# Patient Record
Sex: Female | Born: 1945 | Race: White | Hispanic: No | Marital: Married
Health system: Midwestern US, Academic
[De-identification: ages and names within clinical notes are randomized; demographics above are authoritative.]

---

## 2014-12-30 IMAGING — CT Head^_WITHOUT_CONTRAST (Adult)
1 series · 16 of 30 positions shown, 20 images · non-contrast
Comparison: none

CT BRAIN
REASON FOR EXAM: Sudden onset severe dizziness.
TECHNIQUE: Computed tomography of the brain obtained 12/30/2014.  Images
acquired in the axial plane.  No contrast was given.

[Series 2: brain w/o 4.8 brain · axial · non-contrast · 0.55mm/px · z∈[+99,+228]mm · 16 of 30 slices shown, 20 images]
[im 2/30  brain]
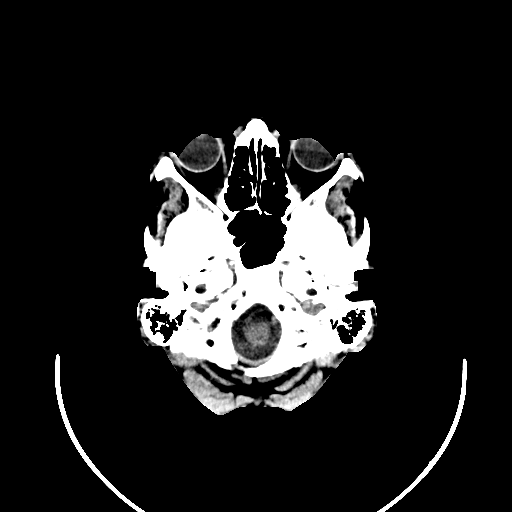
[im 2/30  bone]
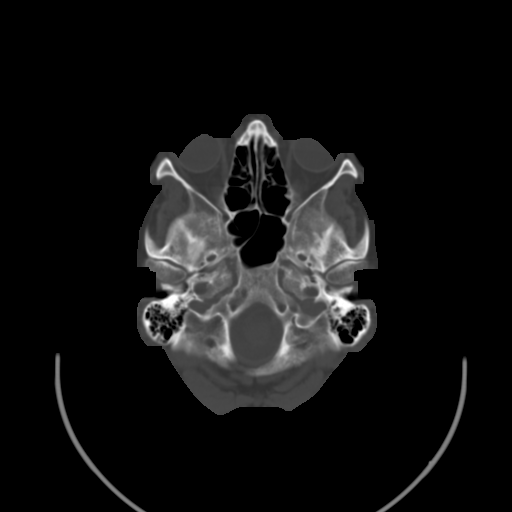
[im 4/30  brain]
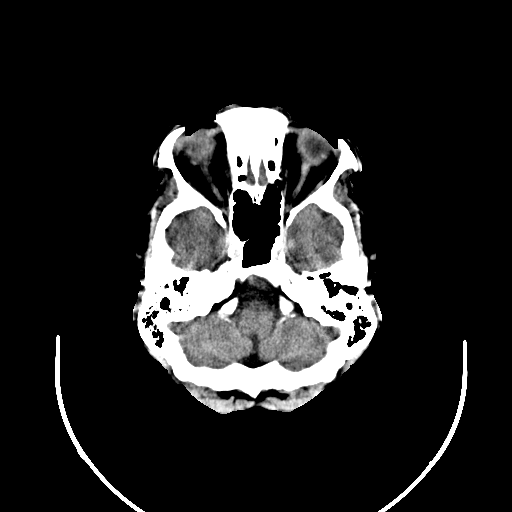
[im 6/30  brain]
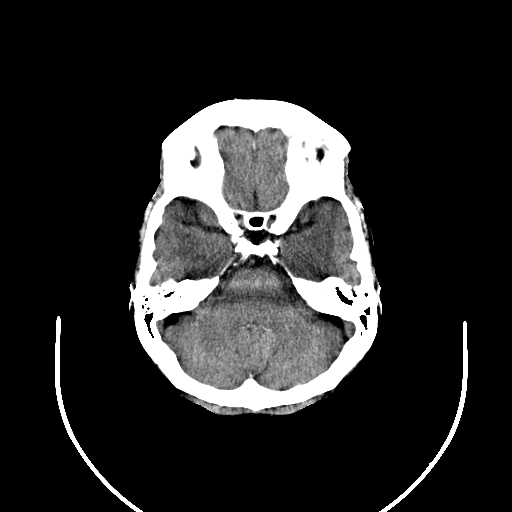
[im 8/30  brain]
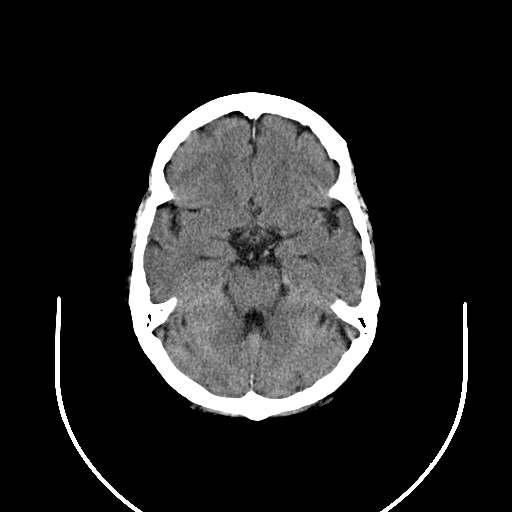
[im 9/30  brain]
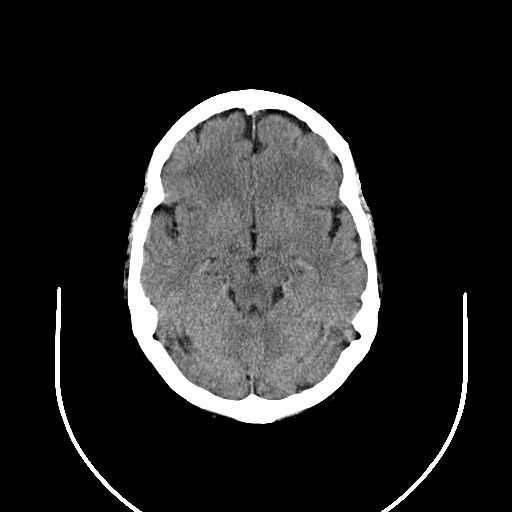
[im 9/30  bone]
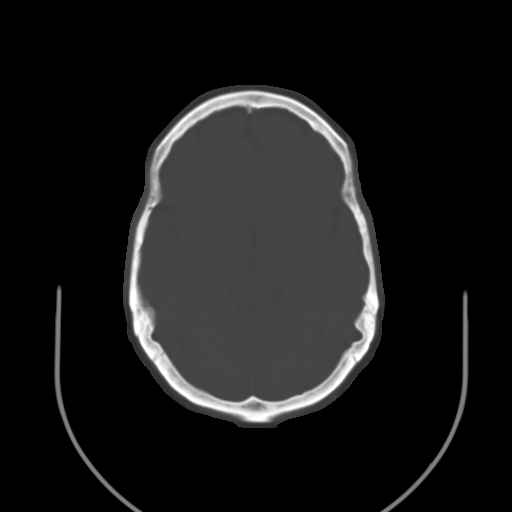
[im 11/30  brain]
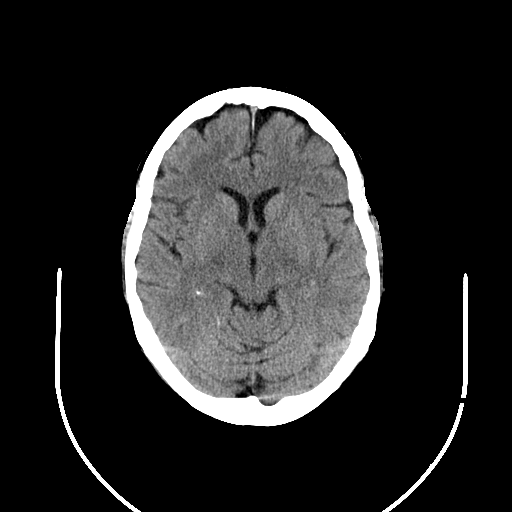
[im 13/30  brain]
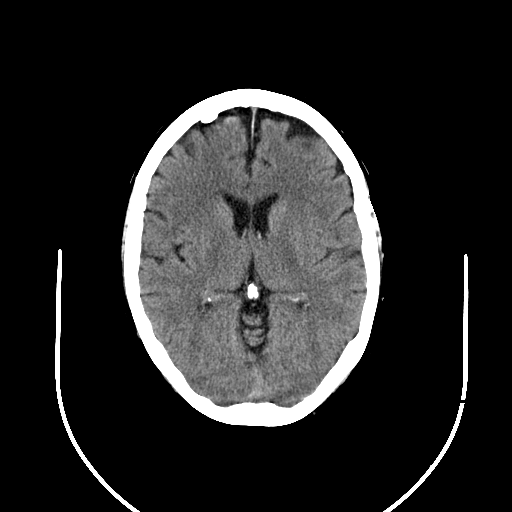
[im 15/30  brain]
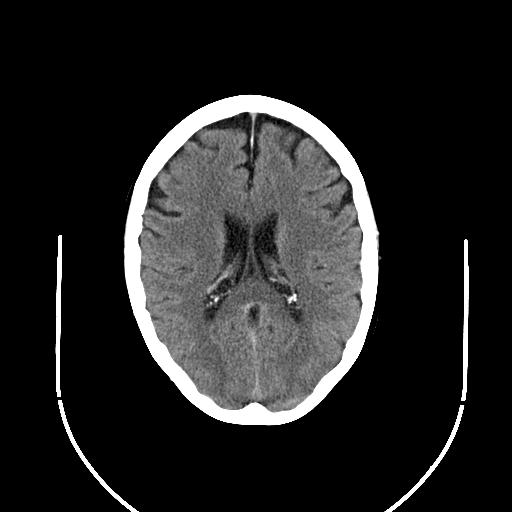
[im 16/30  brain]
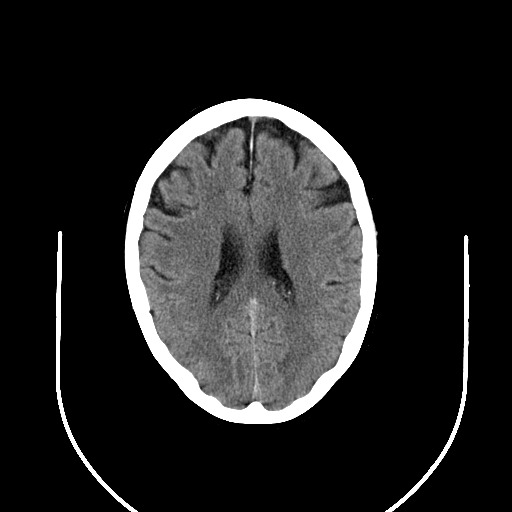
[im 16/30  bone]
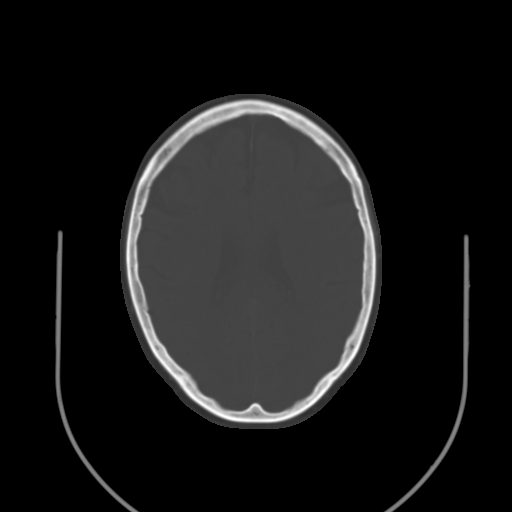
[im 18/30  brain]
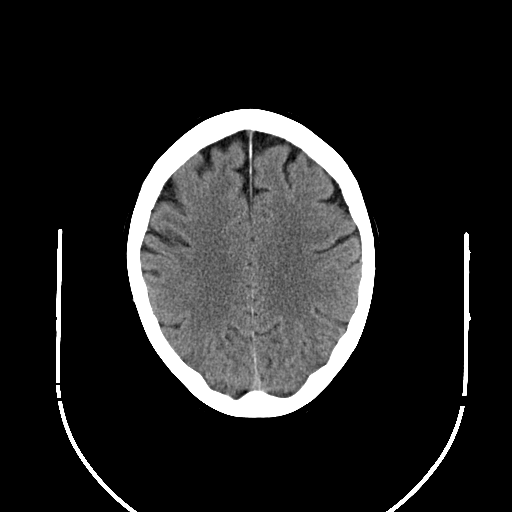
[im 20/30  brain]
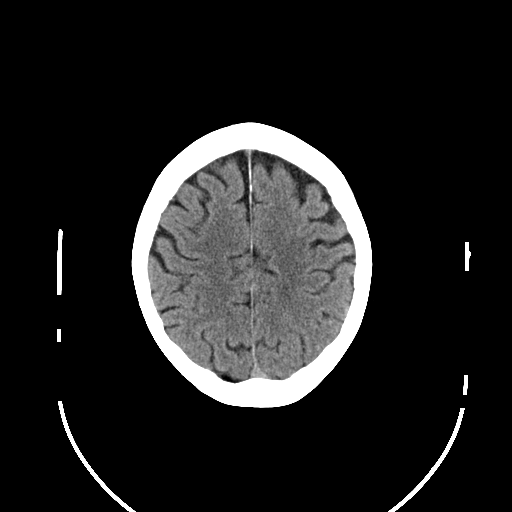
[im 22/30  brain]
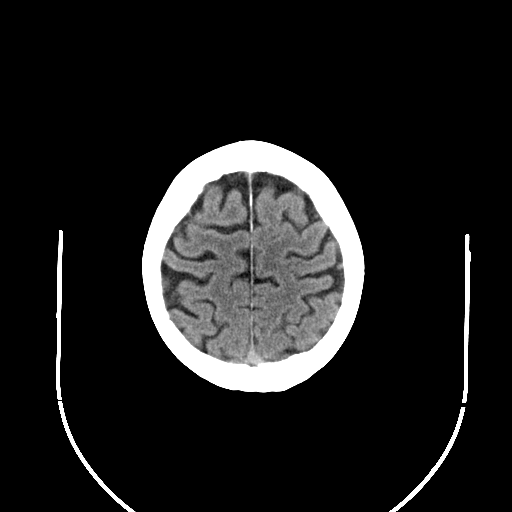
[im 23/30  brain]
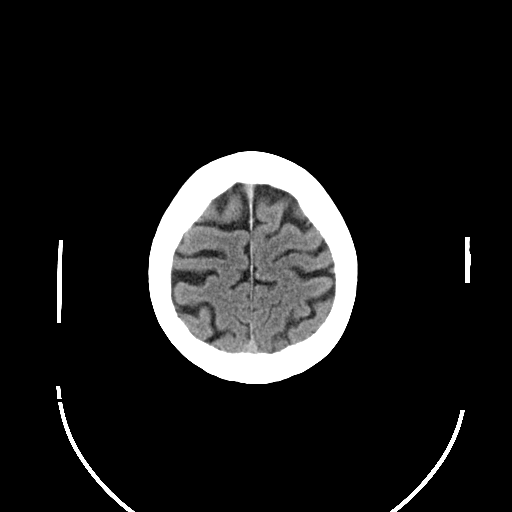
[im 23/30  bone]
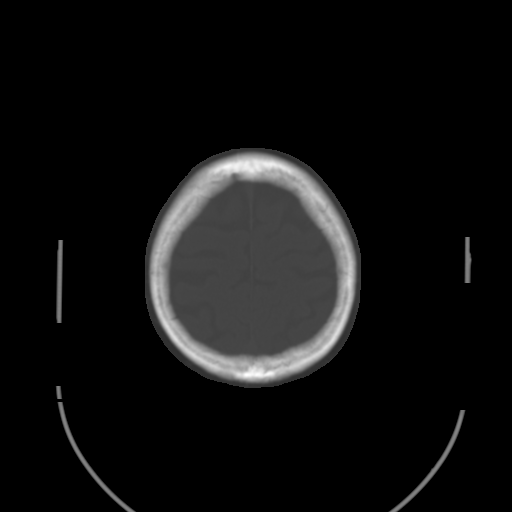
[im 25/30  brain]
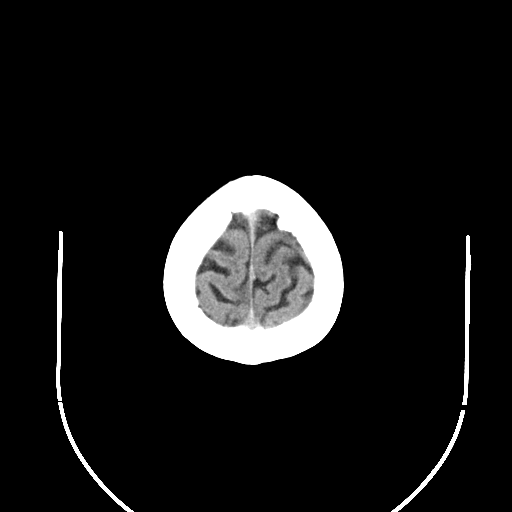
[im 27/30  brain]
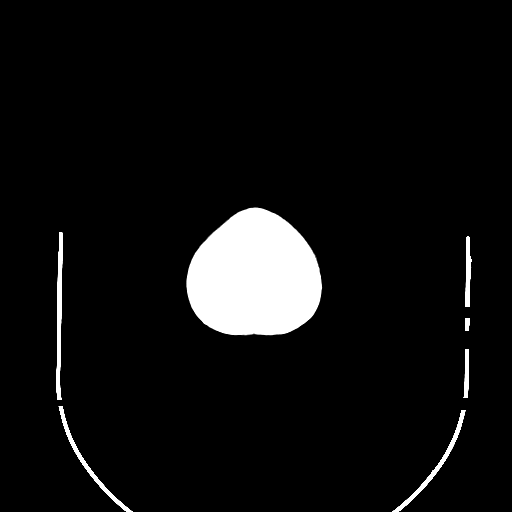
[im 29/30  brain]
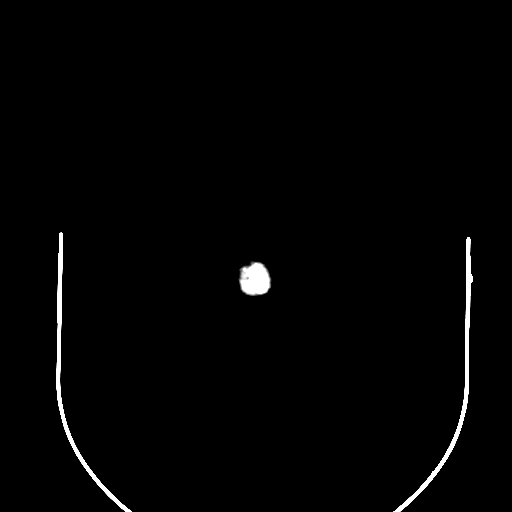

[16 of 30 positions shown; findings below may reference images not displayed]

IMPRESSION: 1. Unremarkable computed tomography of the brain.
FINDINGS: No intracranial hemorrhage, acute infarction or mass.  No extra-axial fluid
collection.  Osseous structures are maintained.  No fracture.  No bony
destructive process.  Mastoid air cells are well-aerated.  Paranasal sinuses
are clear.

Tech Notes: SUDDEN ONSET SEVERE DIZZINESS. LM

## 2016-09-20 ENCOUNTER — Encounter: Admit: 2016-09-20 | Discharge: 2016-09-20 | Payer: MEDICARE

## 2016-09-20 DIAGNOSIS — J45909 Unspecified asthma, uncomplicated: Principal | ICD-10-CM

## 2016-09-20 DIAGNOSIS — I10 Essential (primary) hypertension: ICD-10-CM

## 2016-09-20 DIAGNOSIS — E663 Overweight: ICD-10-CM

## 2016-09-26 ENCOUNTER — Encounter: Admit: 2016-09-26 | Discharge: 2016-09-26 | Payer: MEDICARE

## 2016-09-26 ENCOUNTER — Ambulatory Visit: Admit: 2016-09-26 | Discharge: 2016-09-26 | Payer: MEDICARE

## 2016-09-26 DIAGNOSIS — H9311 Tinnitus, right ear: ICD-10-CM

## 2016-09-26 DIAGNOSIS — R200 Anesthesia of skin: Secondary | ICD-10-CM

## 2016-09-26 DIAGNOSIS — I10 Essential (primary) hypertension: ICD-10-CM

## 2016-09-26 DIAGNOSIS — E663 Overweight: ICD-10-CM

## 2016-09-26 DIAGNOSIS — J329 Chronic sinusitis, unspecified: ICD-10-CM

## 2016-09-26 DIAGNOSIS — J45909 Unspecified asthma, uncomplicated: Principal | ICD-10-CM

## 2016-09-26 DIAGNOSIS — H5711 Ocular pain, right eye: Principal | ICD-10-CM

## 2016-09-26 LAB — URINALYSIS DIPSTICK
Lab: 6 (ref 5.0–8.0)
Lab: NEGATIVE
Lab: NEGATIVE
Lab: NEGATIVE
Lab: NEGATIVE
Lab: NEGATIVE
Lab: NEGATIVE
Lab: NEGATIVE
Lab: 1 (ref 1.003–1.035)
Lab: NEGATIVE

## 2016-09-26 LAB — SED RATE: Lab: 27 mm/h (ref 0–30)

## 2016-09-26 LAB — C REACTIVE PROTEIN (CRP): Lab: 0.4 mg/dL (ref <1.0)

## 2016-09-26 NOTE — Progress Notes
Consultation was requested by Gwenette Greet MD for an opinion regarding vasculitis.    HPI:   71 year old female who dates her symptoms back to May 11th when she developed a sudden intense pain in the right eye which lasted about 2 minutes. This was followed by numbness around the eye and check and up to the right ear. The numbness lasted for about 36-40 hours. She was seen by an ophthalmologist who did not see any changes of the eyes. She denies any prior episodes of similar symptoms in the past. She has had tinnitus which started 18 months ago which has been constant. She denies any hearing loss. She denies any ear pain.     ROS  GENERAL: Energy level has been good.   FEVER: None  WEIGHT CHANGE: Intentional weight loss of 20Ibs since around October.   HEAD: + history of headaches improved with OTC Exedrine. Headaches start on the right side around the temple. No scalp tenderness, jaw claudication, tongue claudication.  ENT: + history of recurrent sinusitis. This is improved with Flonase and claritin. + infrequent bloody crusting. Chronic tinnitus. No problems swallowing. No mouth sores or gum changes. No voice changes.   EYES: + right eye pain as in HPI. No redness, visual blurring, visual loss, dplopia, proptosis.  NECK: No mass, goiter, pain, swelling.  RESPIRATORY: + unchanged cough (productive of clear phlegm). No sputum production, hemoptysis, wheezing, shortness of breath, pleuritic chest pain.  CARDIOVASCULAR: + palpitations. + edema in legs. No chest pain, extremity claudication, digital ischemia, Raynaud's.  GASTROINTESTINAL: No discomfort, BRBPR, black stools, change in bowel habit, vomiting, diarrhea.  URINARY: No dysuria, hematuria, frequency, incontinence.  TESTICULAR/GYN: No abnormal vaginal bleeding, abnormal vaginal discharge, breast symptoms.  MUSCULOSKELETAL: + occasional pain in right 1st CMC and left hip.   NEUROLOGIC: No numbness, weakness. + occasional dizziness. SKIN: No rash, nodules, ulcers, soft tissue loss, itching, steroid induced acne.   MOOD/PSYCHIATRIC: + poor sleep. Unchange mood.  HEMATOLOGIC/LYMPHATIC/IMMUNOLOGIC: + bruising easily. No LAD. No history of miscarriages.   ENDOCRINE: + intolerance to heat.  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   GENERAL APPEARANCE: Well developed.  Well nourished.  Appropriate affect.  No apparent distress.  Alert.   SKIN:  No rashes.  No open wounds.  No alopecia.  Normal nails.   HEAD: Normal, temporal arteries with normal pulsation without nodularity or tenderness.    EYES: Conjunctiva clear, PERRL, EOM normal.  EARS: External ears normal, TM's normal  NOSE/SINUSES: No sinus tenderness, nasal mucosal inflammation, nasal ulcers, nasal crusts, bloody nasal mucosa, septal deviation,  septal perforation, saddle-nose deformity.  OROPHARYNX: No oral lesions present, no oral ulcers.  NECK: Supple, without thyromegaly or adenopathy.  LUNGS: Clear bilaterally.  No crackles, rhonchi, diminished breath sounds.  HEART: RRR, no gallops, rubs or murmurs  ABDOMEN: Soft, non-tender, normal BS, no organomegaly or masses.  MUSCULOSKELETAL: Normal  VASCULAR EXAM      Carotid: normal and equal bilaterally.        Radial:  normal and equal bilaterally. Dorsalis pedis:  normal and equal bilaterally.      Bruit over large vessels: no bruits over carotid, subclavian, abdominal, aorta, renal, femoral.  NEURO: Normal strength.  Cranial nerves II-XII intact.  DTR 2+ throughout.  Normal gait.        LABS:  SPEP normal    Component      Latest Ref Rng & Units 09/26/2016 09/26/2016 09/26/2016 09/26/2016           3:24 PM  3:24 PM  3:24 PM  3:24 PM   Myeloperoxidase AB        <0.2 . . .     Serine Protease3 AB        <0.2 . . .     C-ANCA      TITER <20,NEGATIVE      P-ANCA      TITER <20,NEGATIVE      C-Reactive Protein      <1.0 MG/DL   1.61    Sed Rate -ESR      0 - 30 MM/HR    27     Component      Latest Ref Rng & Units 09/26/2016           3:25 PM   Color,UA       YELLOW   Turbidity,UA      CLEAR-CLEAR CLEAR   Specific Gravity-Urine      1.003 - 1.035 1.020   pH,UA      5.0 - 8.0 6.0   Protein,UA      NEG-NEG NEG   Glucose,UA      NEG-NEG NEG   Ketones,UA      NEG-NEG NEG   Bilirubin,UA      NEG-NEG NEG   Blood,UA      NEG-NEG NEG   Urobilinogen,UA      NORM-NORMAL INCREASED (A)   Nitrite,UA      NEG-NEG NEG   Leukocytes,UA      NEG-NEG NEG   Urine Ascorbic Acid, UA      NEG-NEG NEG       ASSESSMENT:  Right eye pain (resolved)  Right sided facial numbness  Recurrent sinusitis  Chronic right ear tinnitus    Ms Auriemma comes for evaluation of possible vasculitis.  She developed acute right eye pain and right-sided facial numbness.  She also mentioned some tenderness over the right temple at the time.  The symptoms have all since resolved.  Giant cell arteritis can present with right temporal headaches, however it would be highly unlikely for it to resolve so quickly and without any specific treatments such as steroids.  There are forms of small vessel vasculitis such as granulomatosis with polyangiitis (Wegener's) and eosinophilic granulomatosis with polyangiitis (Churg-Strauss) that can affect the sinuses and middle ears which could potentially cause similar symptoms.  I do not see any other features suggestive of these diseases.  In addition, ANCA serologies today were negative.  I think it is unlikely that she has a systemic vasculitis.  I can see her as needed  should she develop other persistent features.    PLAN:  Labs today (done)  Follow-up: as needed    Fernanda Drum, MD MS  Director of Vasculitis Clinic  [facility]  Division of Allergy, Clinical Immunology and Rheumatology  [address]  [place], [place] [address]

## 2016-09-27 LAB — MPO/PR-3

## 2016-09-27 LAB — ELECTROPHORESIS-SERUM PROTEIN
Lab: 11 % (ref 5–15)
Lab: 4.9 % (ref 2–6)
Lab: 59 % (ref 48–68)
Lab: 7.5 g/dL (ref 6.0–8.0)

## 2016-09-28 LAB — ANTI-NEUT CYTO AB (ANCA/PANCA)
Lab: <20 {titer}
Lab: <20 {titer}

## 2016-09-29 ENCOUNTER — Encounter: Admit: 2016-09-29 | Discharge: 2016-09-29 | Payer: MEDICARE

## 2019-12-15 IMAGING — CT ABDOMEN_PELVIS W(Adult)
2 of 6 series · 10 of 46 positions shown, 11 images · non-contrast
Comparison: none

[Series 2: abdomen_pelvis ax 3.00 br40 s3 · axial · 0.43mm/px · z∈[+1369,+1704]mm · 7 of 141 slices shown, 8 images]
[im 15/141  soft-tissue]
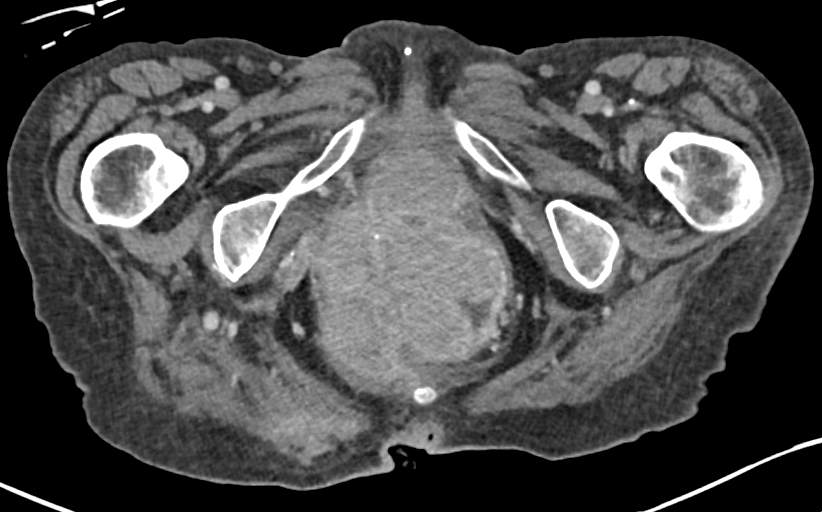
[im 15/141  bone]
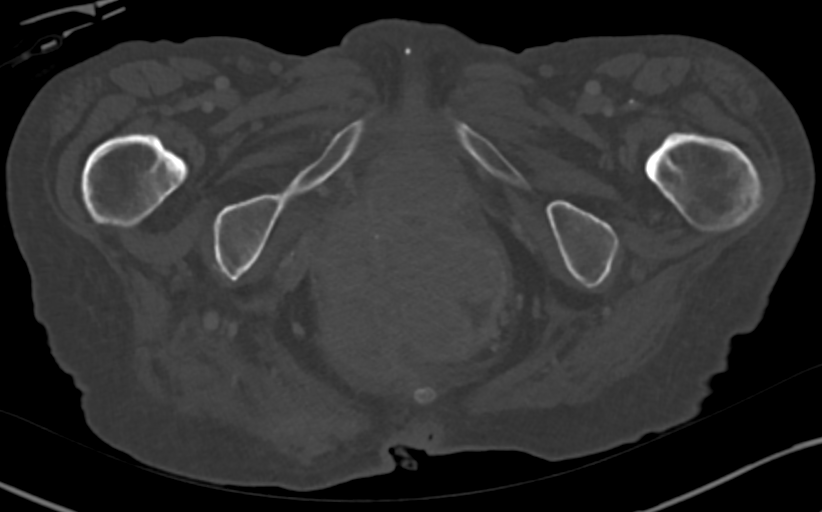
[im 36/141  soft-tissue]
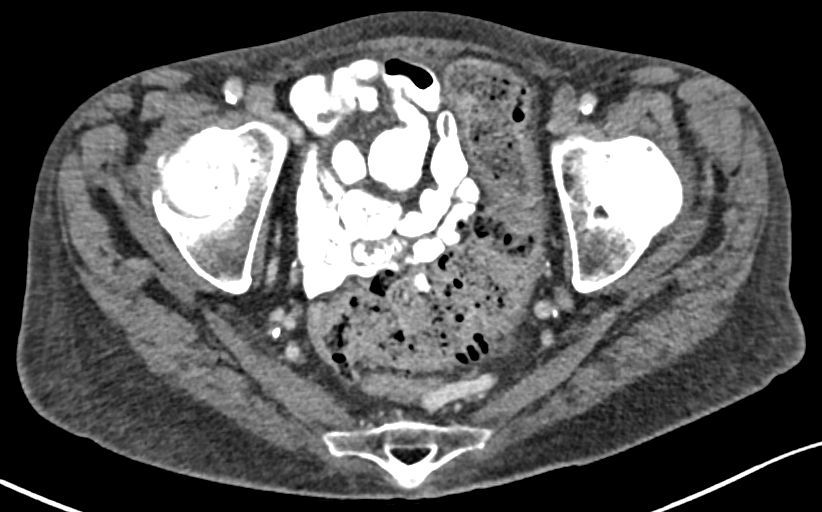
[im 50/141  soft-tissue]
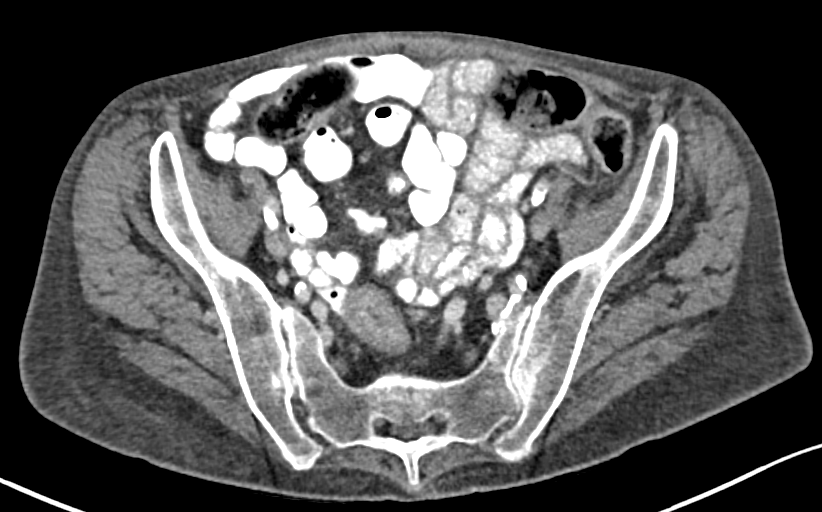
[im 71/141  soft-tissue]
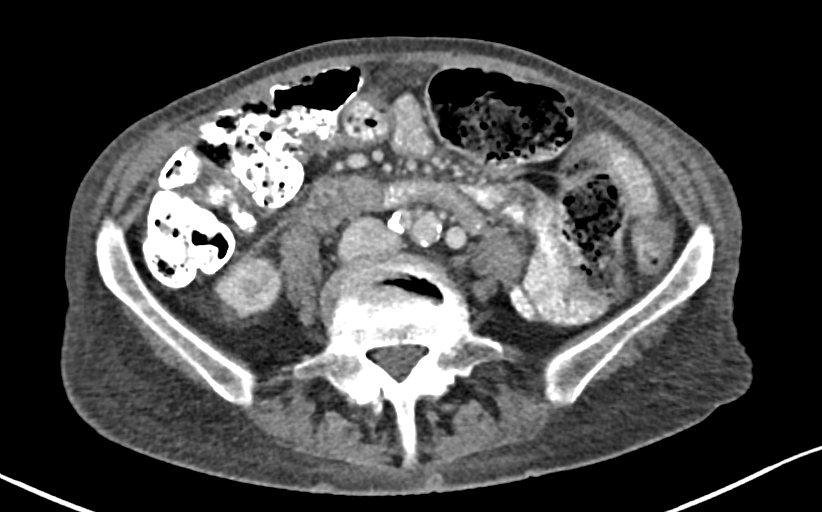
[im 92/141  soft-tissue]
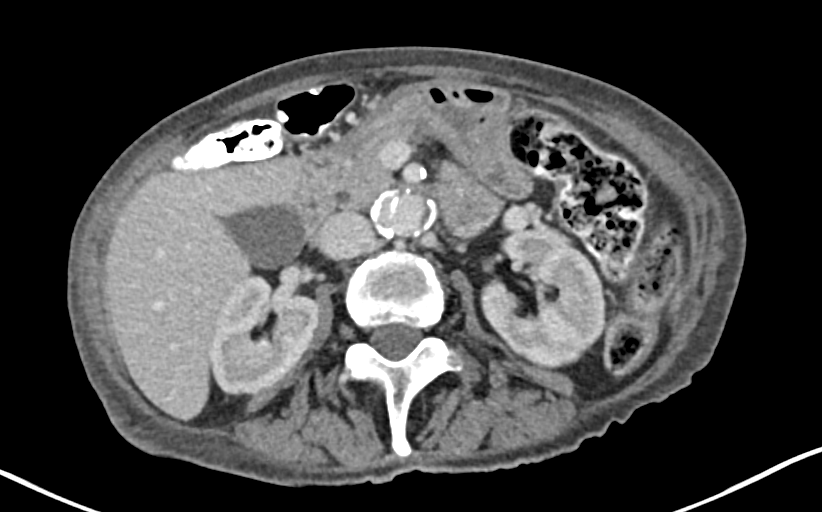
[im 106/141  soft-tissue]
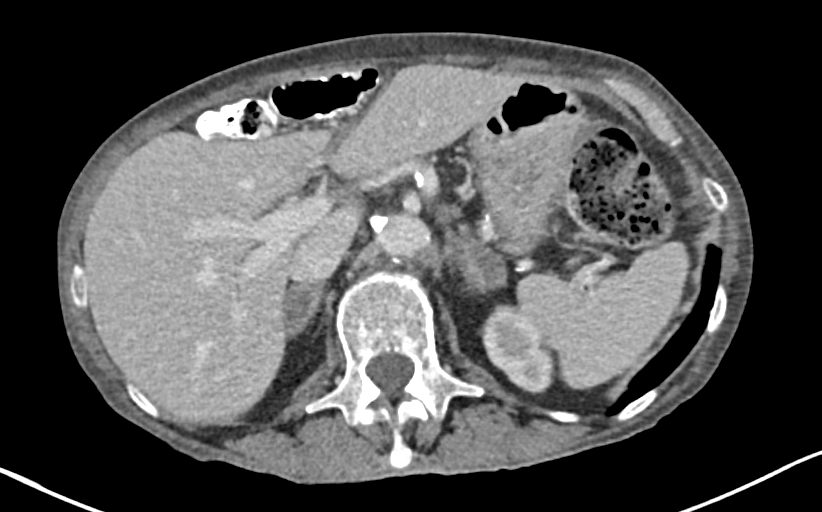
[im 127/141  soft-tissue]
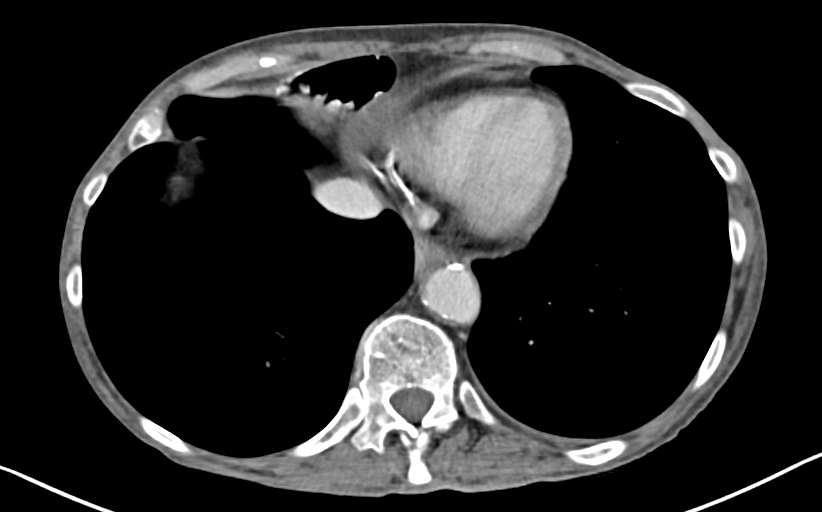

[Series 4: abdomen_pelvis cor 3.00 br40 s3 · coronal · 0.69mm/px · 3 of 73 slices shown]
[im 19/73  soft-tissue]
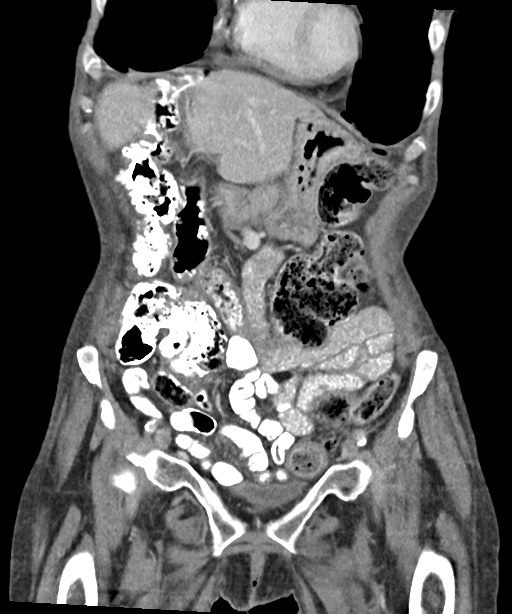
[im 37/73  soft-tissue]
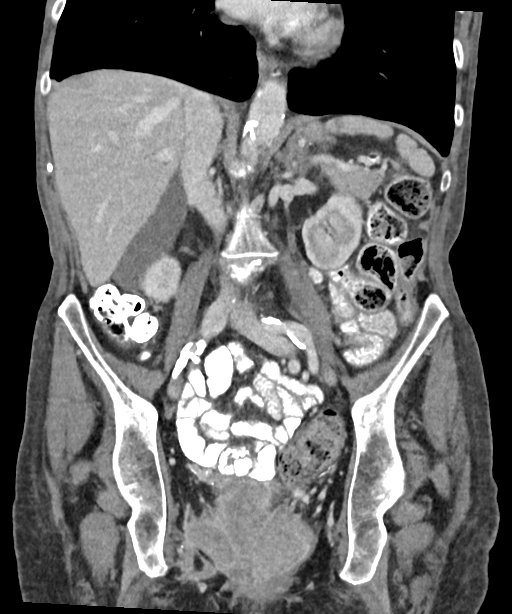
[im 55/73  soft-tissue]
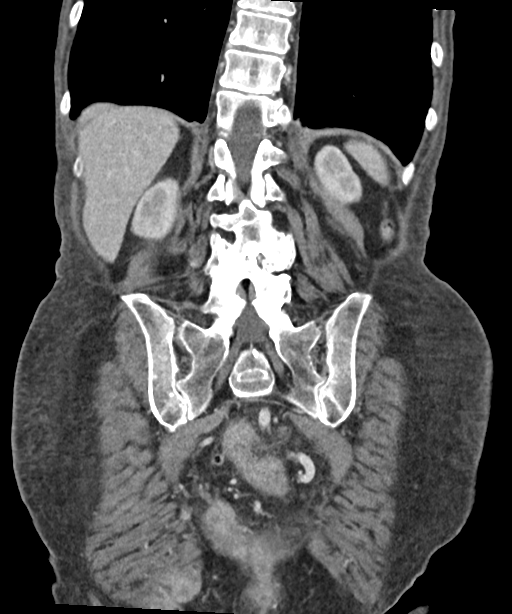

[10 of 46 positions shown; findings below may reference images not displayed]

EXAM

CT abd/pel w iv   oral con

INDICATION

unintentional weight loss, hematechezia
PT STATES HAS UNINTENTIONAL WT LOSS. RECTAL BLEEDING X 6-8 MONTHS. FATIGUE. GFR 37. 50ML WWEHJDD.
CT/NM 0/0. TJ

TECHNIQUE

CT of the abdomen and pelvis was performed. All CT scans at this facility use dose modulation,
iterative reconstruction, and/or weight based dosing when appropriate to reduce radiation dose to as
low as reasonably achieved.

# of CT scans in the past year: 0 # of Myocardial perfusion scans this past year: 0

COMPARISONS

None available at the time of dictation.

FINDINGS

Lung bases: Trace amount of pleural parenchymal scarring versus atelectasis at the periphery of the
left lower lobe. Normal cardiac size without a pericardial effusion.

Liver: Fluid density 1 cm cyst within the right hepatic lobe, incompletely characterized (axial

Gallbladder and Biliary Tree: Trace cholelithiasis. No intrahepatic or extrahepatic biliary
dilation.

Spleen: Small splenic calcification

Pancreas: Unremarkable

Adrenal Glands: Incomplete characterization of bilateral adrenal gland nodularity with hypodensity
measuring up to 1.7 cm.

Kidneys: Symmetric contrast enhancement without evidence of a suspicious focal lesion. Multiple
bilateral renal cortical cysts. No hydroureteronephrosis or renal stone.

Bladder: Unremarkable for the degree of distention.

Pelvic Organs: Hysterectomy. As described below, large heterogeneously enhancing mass indeterminate
whether incontinuity with or merely effacing and displacing the posterior vaginal wall (series 2,
image 119).

Bowel: Stomach is decompressed limiting its evaluation. Oral contrast extends to the descending
colon. Large amount of stool burden compatible with constipation. Large suspected heterogeneously
enhancing mass causing distal rectal narrowing with involvement of the low rectum and extending into
the anal verge. Suspected heterogeneous extension into the right mesorectal fat and possible
extension into the posterior vaginal wall, incompletely characterized (series 2, image 122).
Extension into the right ischial anal fossa with development of the peripheral thick-walled likely
hypoattenuating fluid collection posteriorly along the right ischial anal fossa fat (series 2, image
137). There is no free air. Perianal possible fistula extending posteriorly inferiorly into the
perineum (series 2, image 134) measuring up to 2.6 cm

Ascites: Absent

Lymphadenopathy: No pathologically enlarged or morphologically abnormal lymph nodes by CT
appearance.

Vasculature: Vascular calcifications. Noncalcified and calcified plaque.

Abdominal Wall and Mesentery: Slight diffuse anasarca with edematous appearance of the subcutaneous
fat

Musculoskeletal: Degenerative changes of the visualized spine without evidence of an aggressive
osseous lesion or fracture. Grade 1 anterolisthesis L3 over L4. Trace retrolisthesis L1 over L2.
Severe facet arthropathy within the lower lumbar spine. Severe degenerative change of the left
greater than right hips.

IMPRESSION
1. Large suspected heterogeneously enhancing mass causing distal rectal narrowing with involvement
of the low rectum and extending into the anal verge.
2. Suspected heterogeneous extension into the right mesorectal fat and possible extension into the
posterior vaginal wall, incompletely characterized.
3. Invasion into the right ischioanal fossa with development of a peripheral thick-walled fluid
collection posteriorly propagating posteriorly. Consideration for subjacent abscess formation versus
centrally necrotic tumor invasion.
4. Suspected perianal fistula measuring up to 2.6 cm extending posteriorly inferiorly along the
perineum.
5. Bilateral adrenal gland nodular masses, incompletely characterize and metastases are not
excluded. No definitive retroperitoneal or pelvic lymphadenopathy.

Tech Notes:

PT STATES HAS UNINTENTIONAL WT LOSS. RECTAL BLEEDING X 6-8 MONTHS. FATIGUE. GFR 37. 50ML WWEHJDD.
CT/NM 0/0. TJ

## 2019-12-15 IMAGING — CR CHEST
2 series · 2 of 2 positions shown · non-contrast
Comparison: none

[chest pa]
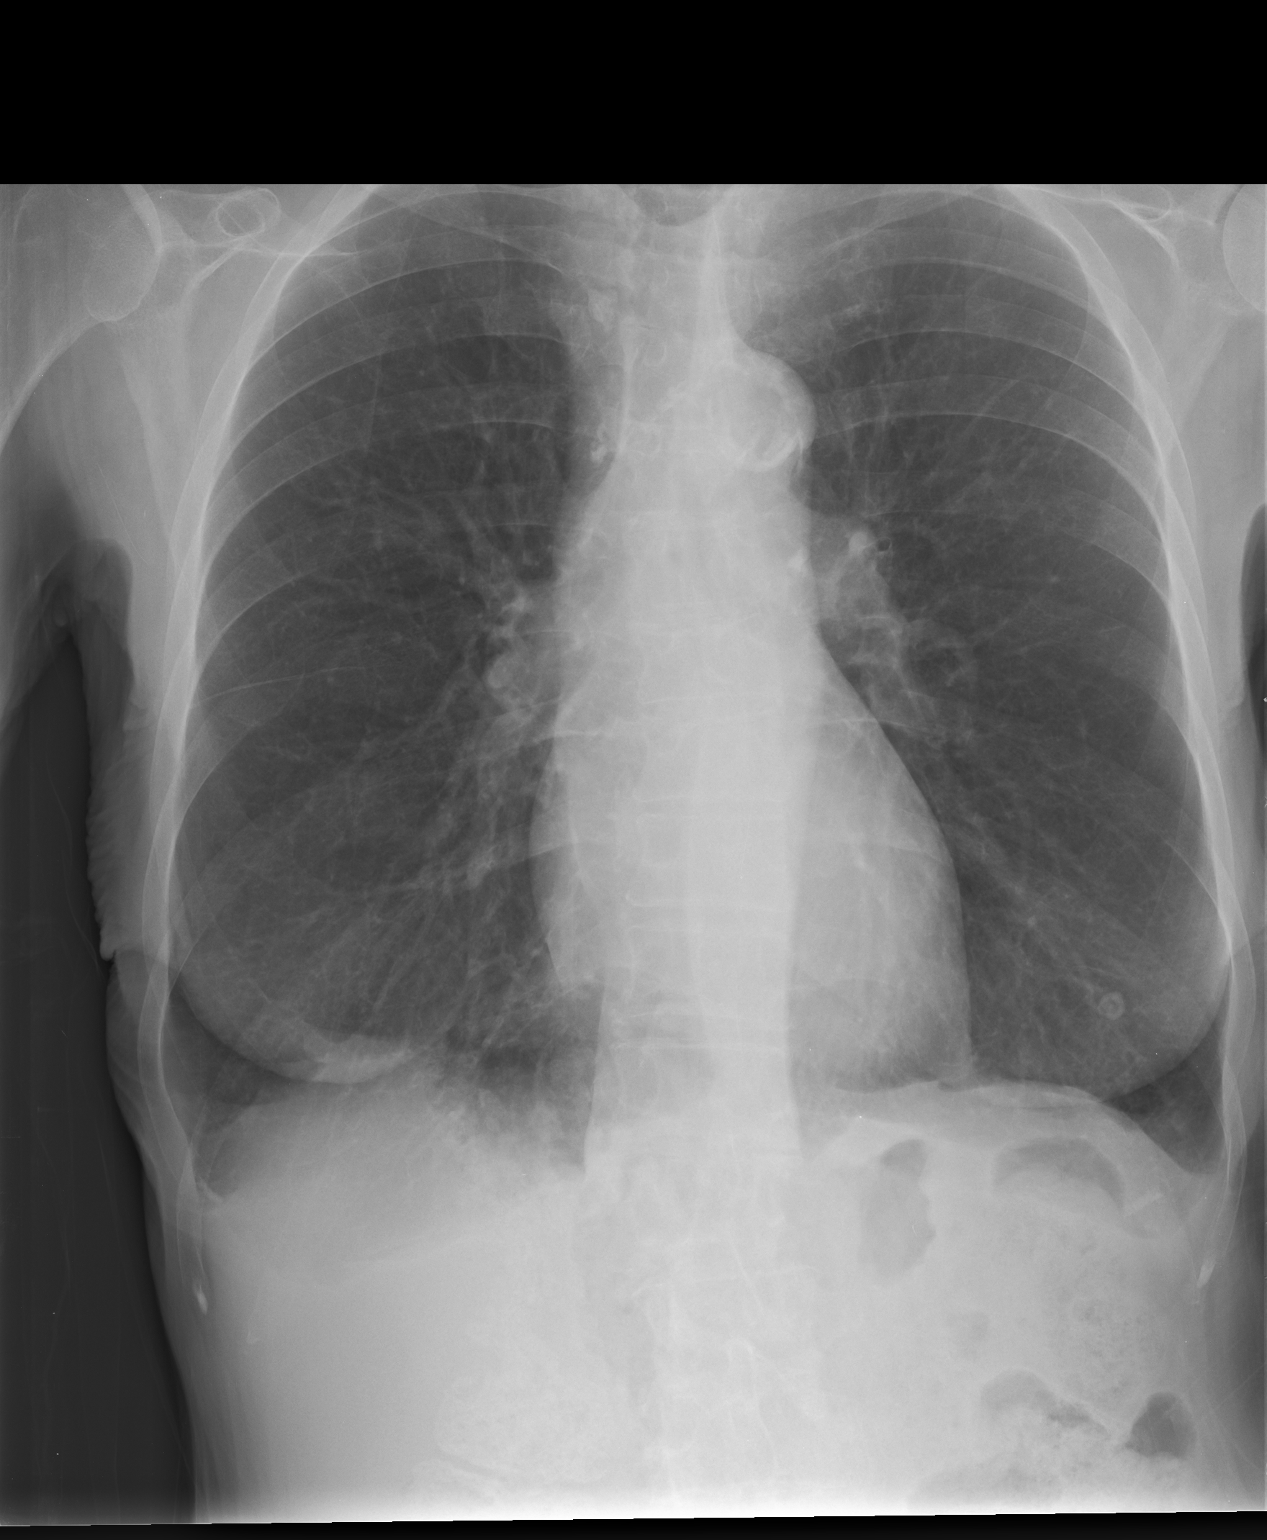

[chest lat]
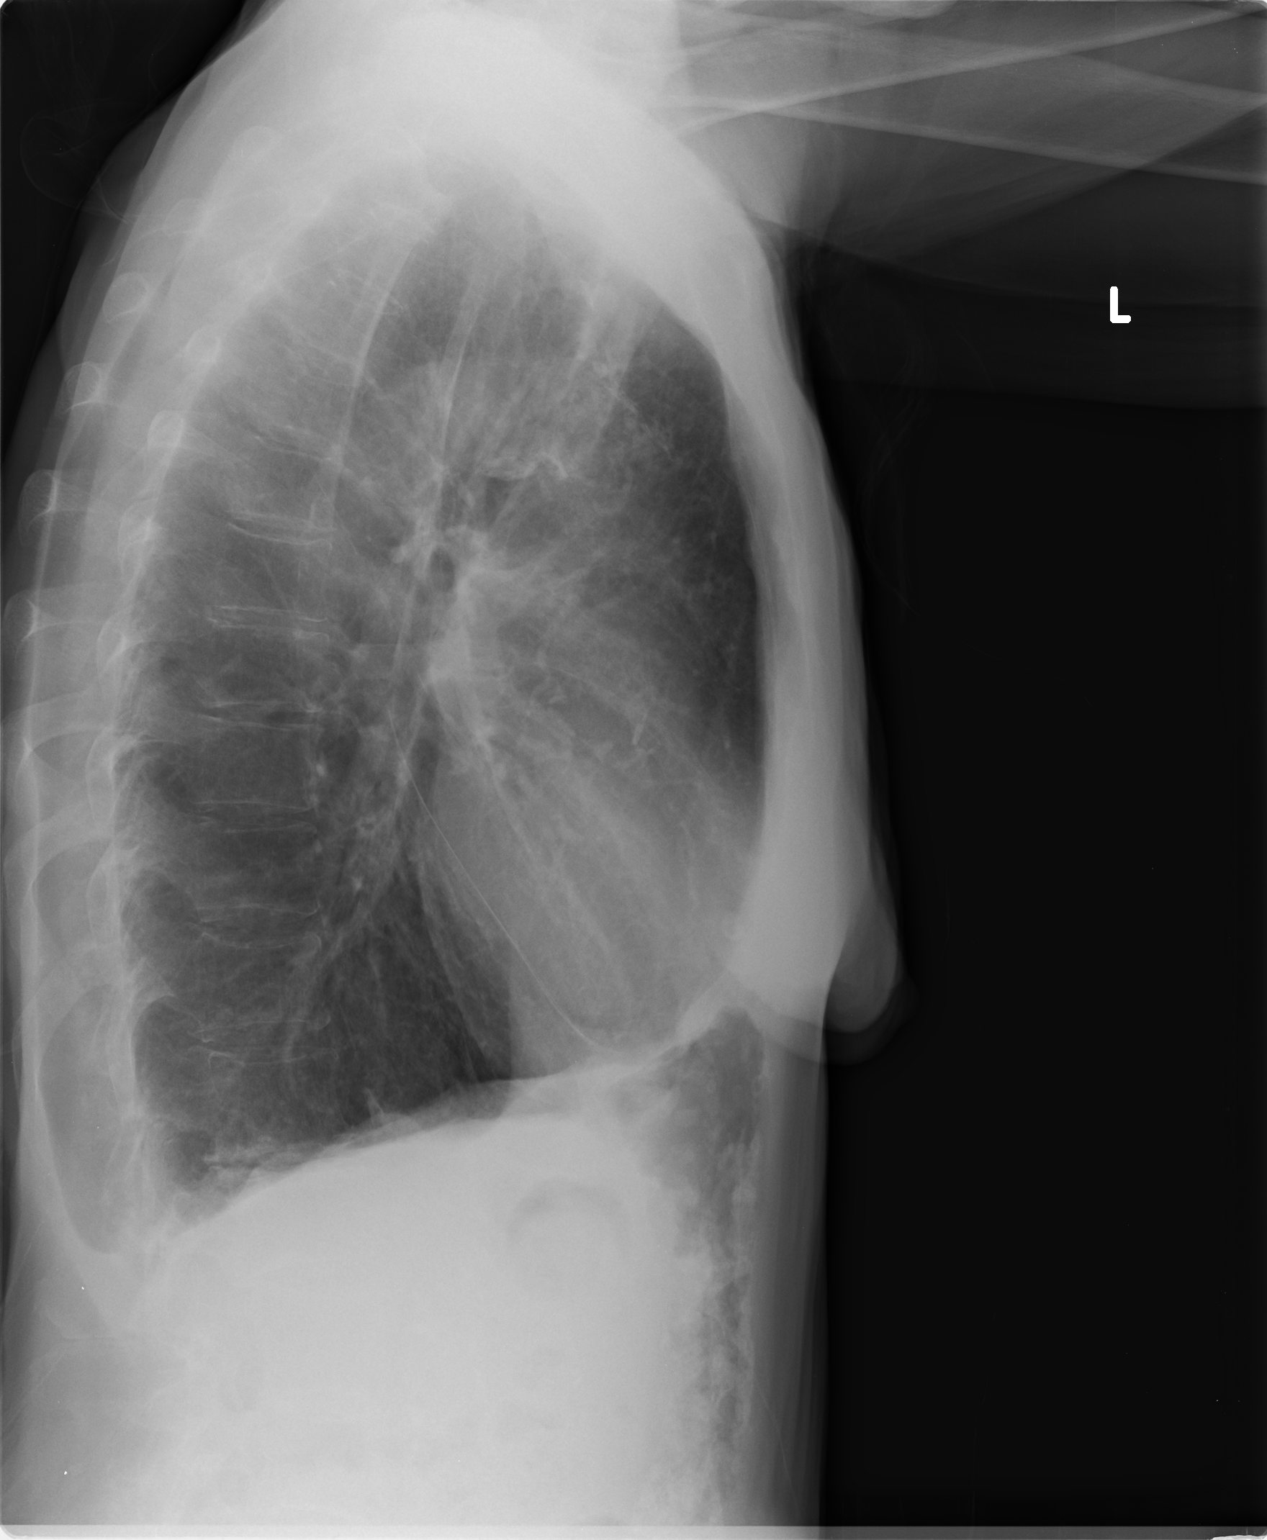

[2 of 2 positions shown; findings below may reference images not displayed]

EXAM

XR chest 2V

INDICATION

COPD

TECHNIQUE

PA and Lateral views of the chest

COMPARISONS

None available at the time of dictation.

FINDINGS

Small bilateral pleural effusions. Suspected hyperinflation.

The cardiomediastinal silhouette is normal in size.

The osseous structures are without an acute osseous abnormality. Minimal superior endplate height
loss measuring up to 10 percent of presumably T7. Large amount of stool burden within the upper
abdomen.

Calcified granuloma within the left lung base.

IMPRESSION
1. Small suspected pleural effusions.
2. Suspected COPD.

Tech Notes:

Patient c/o SOA. Hx of COPD and hypertension. HB/BM

## 2019-12-25 IMAGING — PT NM PETCT skull to thigh
3 series · 25 of 25 positions shown · non-contrast
Comparison: none

[Series 3: ct atten cor head in 3.75 thk · axial · 3.8mm · 1.37mm/px · z∈[-856,-116]mm · 9 of 227 slices shown]
[im 1/227]
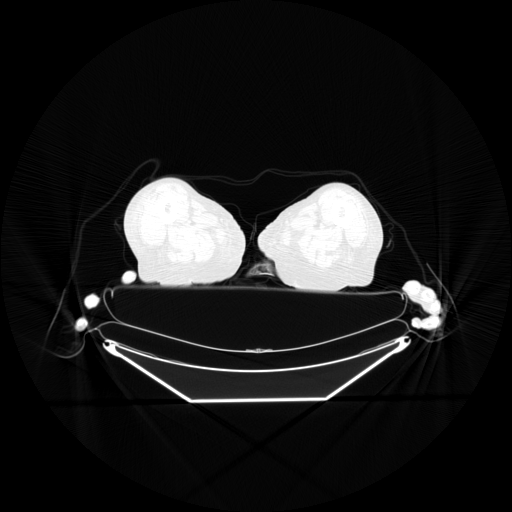
[im 29/227]
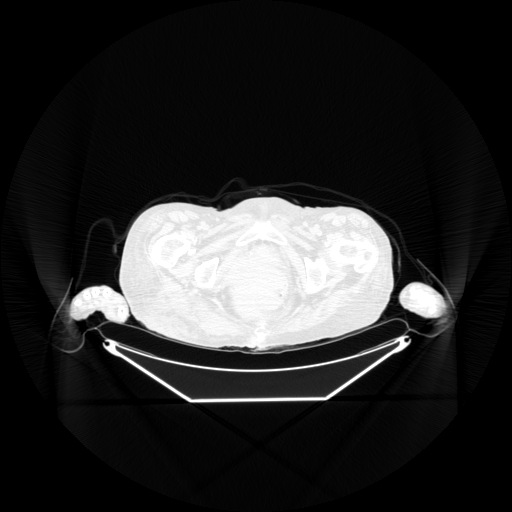
[im 57/227]
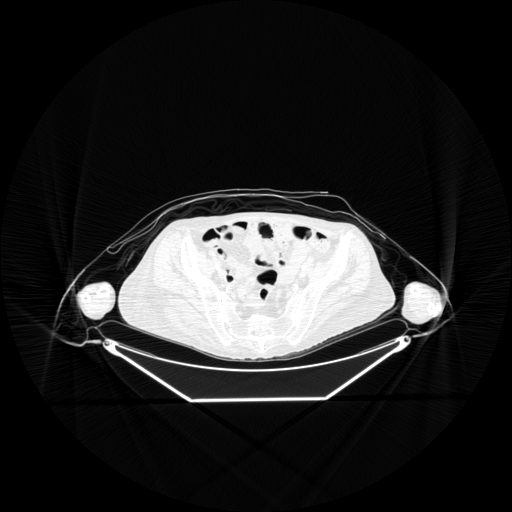
[im 85/227]
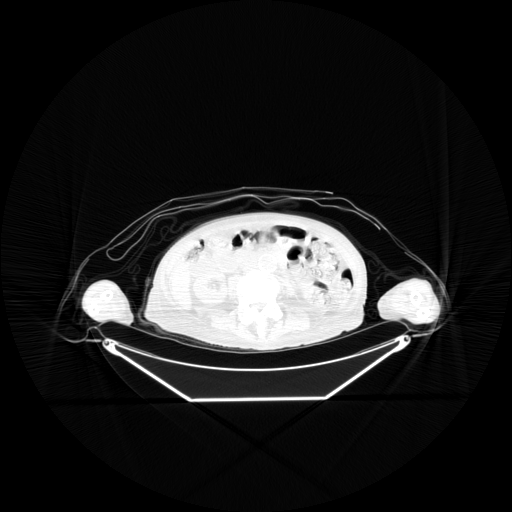
[im 114/227]
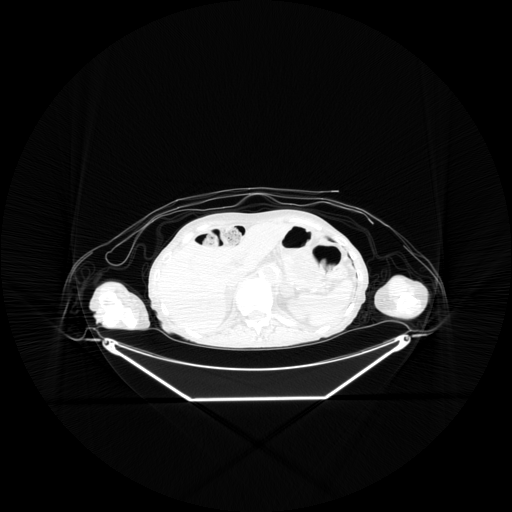
[im 142/227]
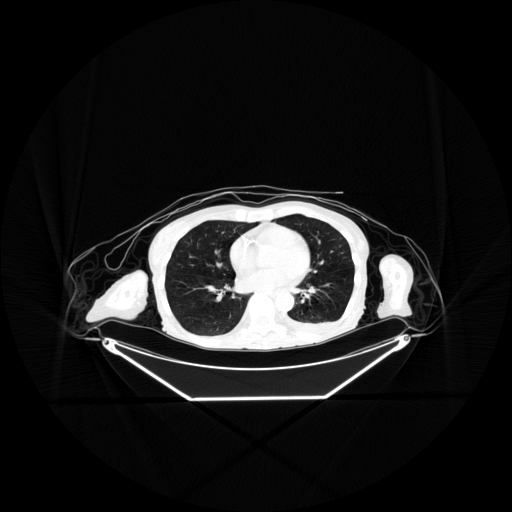
[im 170/227]
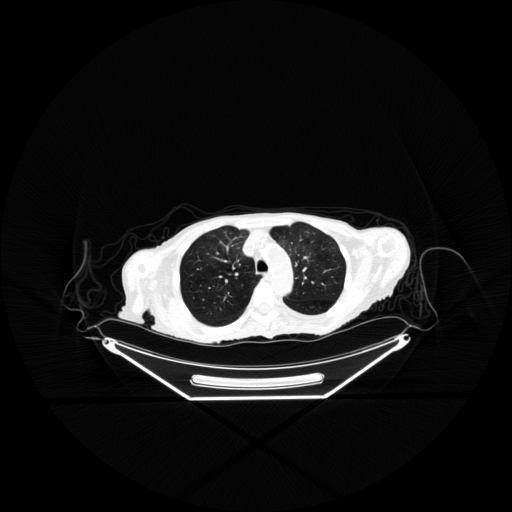
[im 198/227]
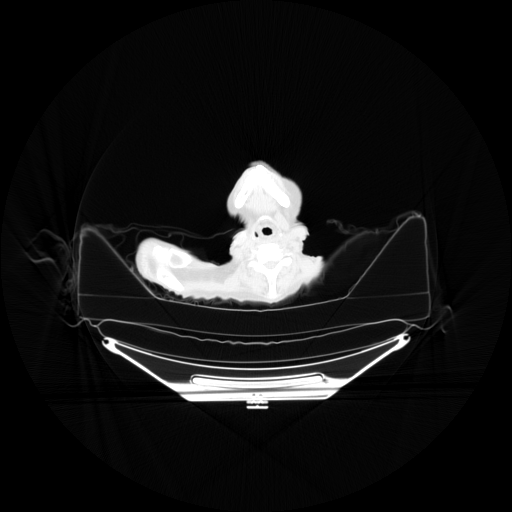
[im 227/227]
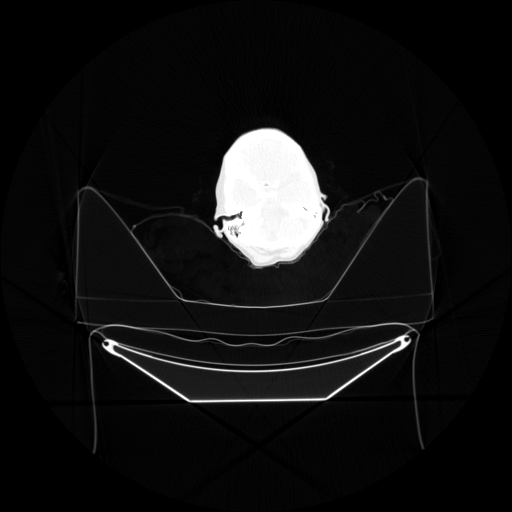

[Series 4: pet ac 3d · axial · 3.3mm · 5.47mm/px · z∈[-856,-116]mm · 8 of 227 slices shown]
[im 1/227]
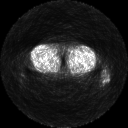
[im 33/227]
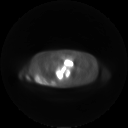
[im 65/227]
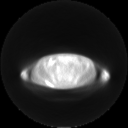
[im 97/227]
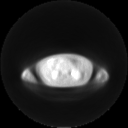
[im 130/227]
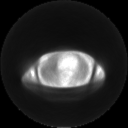
[im 162/227]
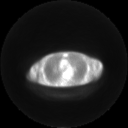
[im 194/227]
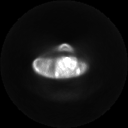
[im 227/227]
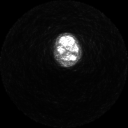

[Series 5: pet nac 3d · axial · 3.3mm · 5.47mm/px · z∈[-856,-116]mm · 8 of 227 slices shown]
[im 1/227]
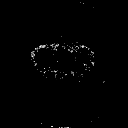
[im 33/227]
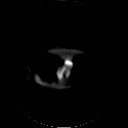
[im 65/227]
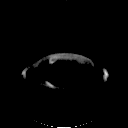
[im 97/227]
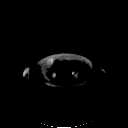
[im 130/227]
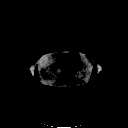
[im 162/227]
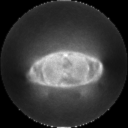
[im 194/227]
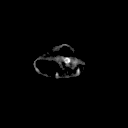
[im 227/227]
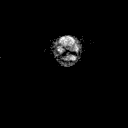

[25 of 25 positions shown; findings below may reference images not displayed]

DIAGNOSTIC STUDIES

EXAM

Whole-body PET-CT.

INDICATION

Rectal cancer

TECHNIQUE

Whole-body PET-CT was obtained approximately 45 minutes following intravenous administration
millicuries of FDG. The patient's serum blood glucose was 108 at the time injection. Mean hepatic

COMPARISONS

None available

FINDINGS

There is an extensive hypermetabolic right central and pelvic mass which extends lateral digital
involve the ischial rectal fossa and the medial margin of the gluteus musculature. Nodular densities
are now noted within the subcutaneous fat overlying the right gluteal region which demonstrate
increased activity concerning for contiguous CT spread into the subcutaneous fat as well. This may
be inflammatory and clinical evaluation to exclude infection is recommended. There is activity along
the course of the sigmoid colon which could be physiologic or represent additional areas of mucosal
involvement.

No pelvic mesenteric or abdominal adenopathy is seen. No abnormal activity is seen within the liver
or lungs. Small left pleural effusion is noted.

IMPRESSION

Extensive marked metabolic activity involving the previously noted right pelvic mass. There is
posterior extension below the sacrum and coccyx with lateral extension into the right ischial rectal
fossa as well as along the medial aspect of the right gluteal musculature. Increased activity can be
identified in the subcutaneous fat diffusely overlying the right gluteal region concerning for
additional subcutaneous tumor extension. Inflammatory change could account for some of these
findings.

No abdominal or thoracic adenopathy is seen. No abnormal activity is seen throughout the lungs.

Metabolic activity is seen along more proximal sigmoid colon. This may be physiologic or represent
additional mucosal disease. Colonoscopy may be beneficial.

Small left pleural effusion.

Tech Notes:

## 2019-12-29 IMAGING — MR Pelvis^ROUTINE
8 of 9 series · 35 of 48 positions shown · non-contrast
Comparison: none

[Series 3: t1_tse_axial · axial · 7.0mm · 0.68mm/px · z∈[-183,+59]mm · 5 of 30 slices shown]
[im 1/30]
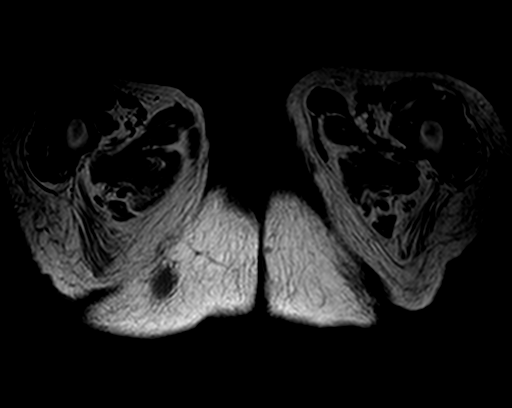
[im 8/30]
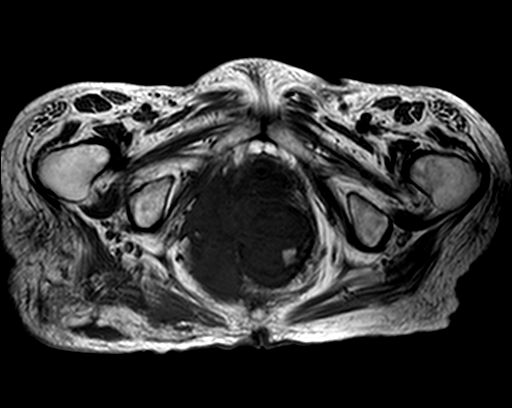
[im 15/30]
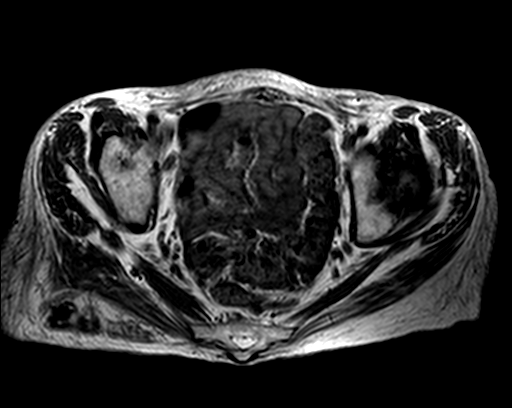
[im 22/30]
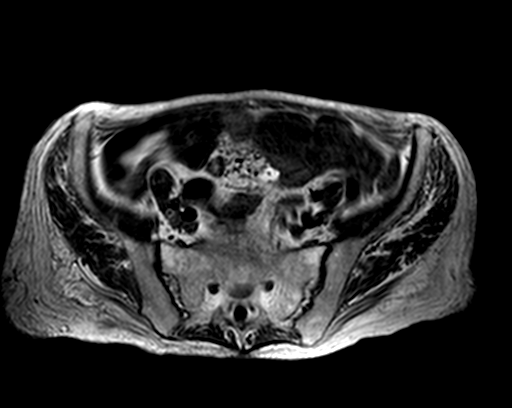
[im 30/30]
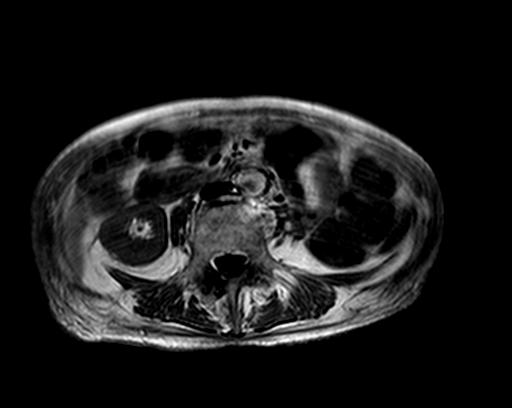

[Series 4: t2_tse_sag · sagittal · 3.0mm · 0.72mm/px · 8 of 44 slices shown]
[im 1/44]
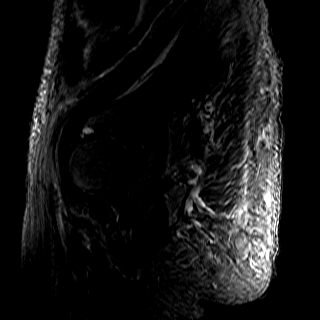
[im 7/44]
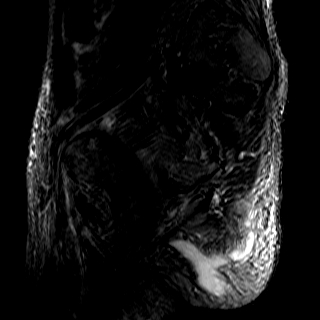
[im 13/44]
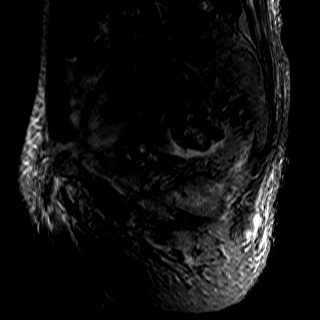
[im 19/44]
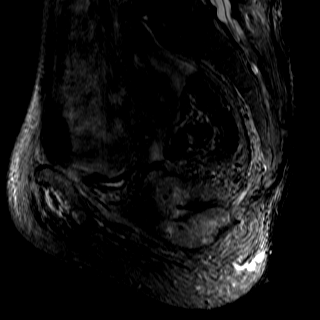
[im 25/44]
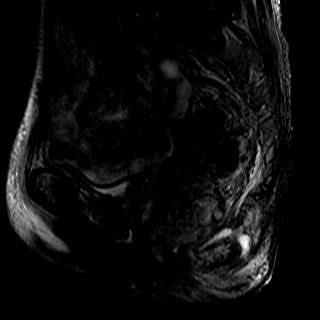
[im 31/44]
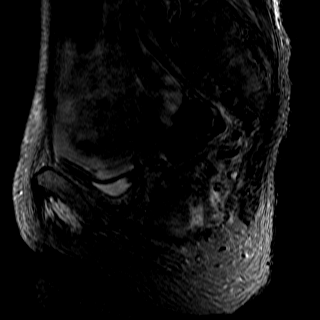
[im 37/44]
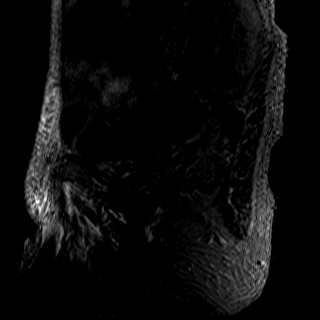
[im 44/44]
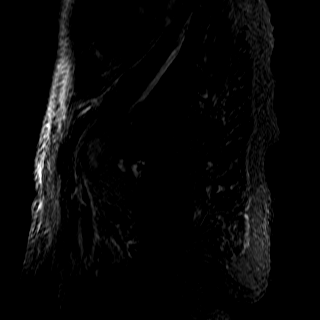

[Series 5: t2_coronal thin · coronal · 3.0mm · 0.51mm/px · 6 of 33 slices shown]
[im 1/33]
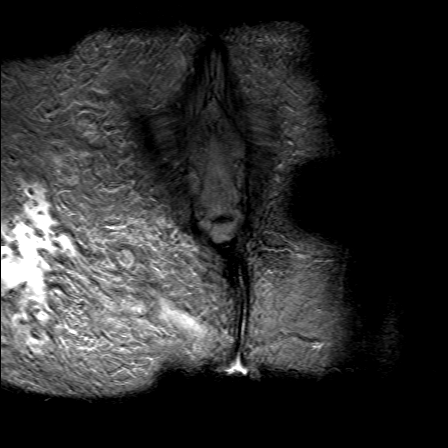
[im 7/33]
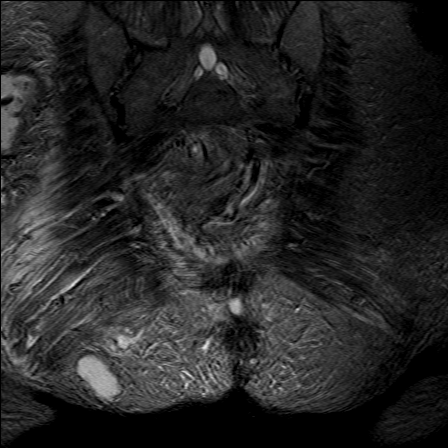
[im 13/33]
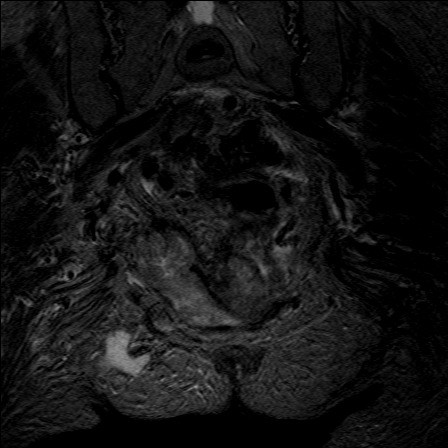
[im 20/33]
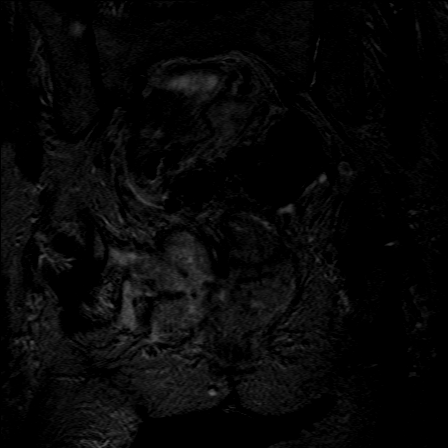
[im 26/33]
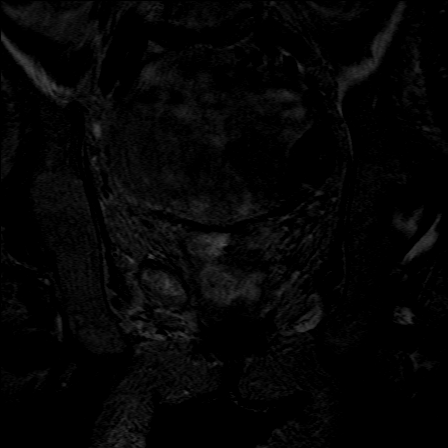
[im 33/33]
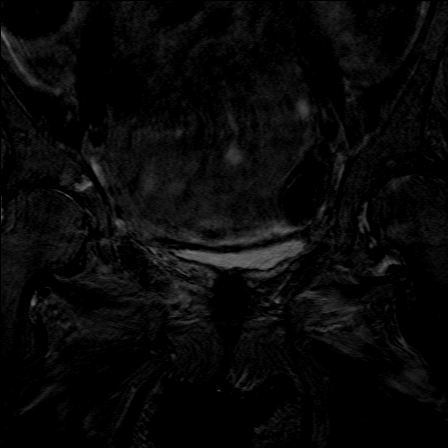

[Series 6: t2_axial thin · axial · 5.0mm · 0.72mm/px · z∈[-169,-34]mm · 4 of 24 slices shown]
[im 1/24]
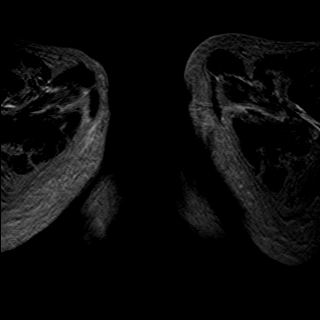
[im 8/24]
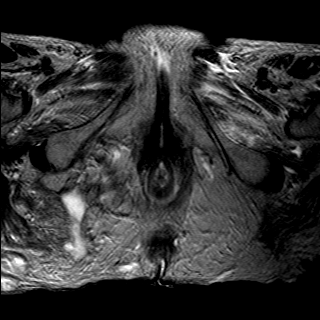
[im 16/24]
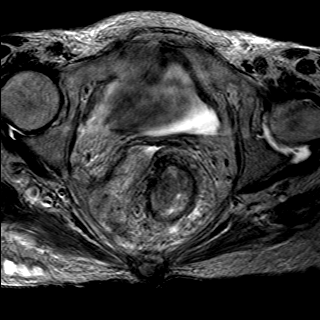
[im 24/24]
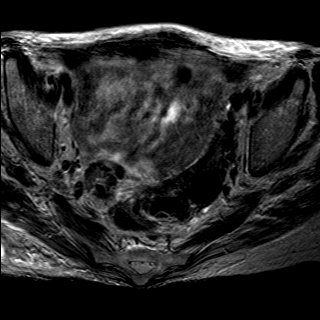

[Series 7: T1 fat-sat · axial · non-contrast · 5.0mm · 0.72mm/px · z∈[-172,-36]mm · 4 of 24 slices shown]
[im 1/24]
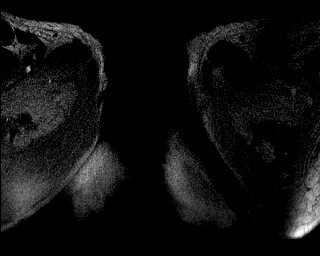
[im 8/24]
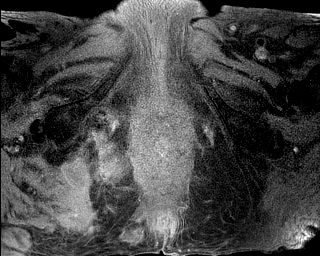
[im 16/24]
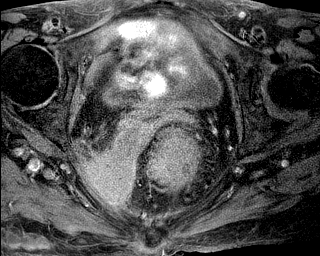
[im 24/24]
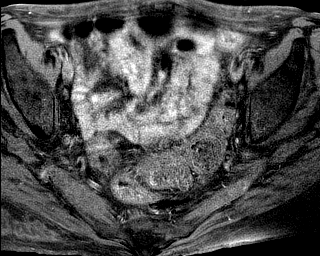

[Series 8: T1 · coronal · 8.0mm · 0.85mm/px · 3 of 20 slices shown]
[im 1/20]
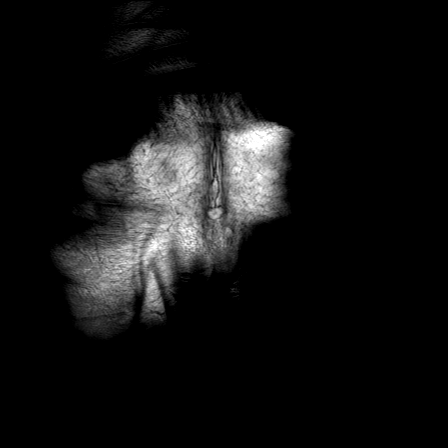
[im 10/20]
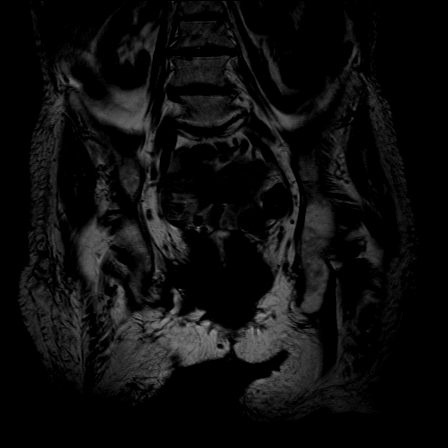
[im 20/20]
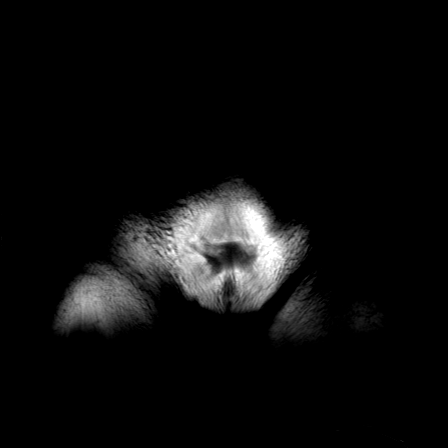

[Series 9: T1 fat-sat post-contrast · axial · 5.0mm · 0.72mm/px · z∈[-172,-36]mm · 4 of 24 slices shown (1 of 2)]
[im 1/24]
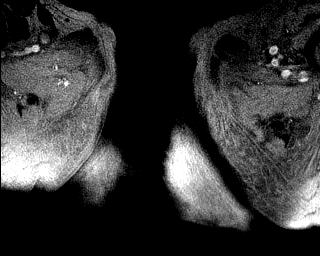
[im 8/24]
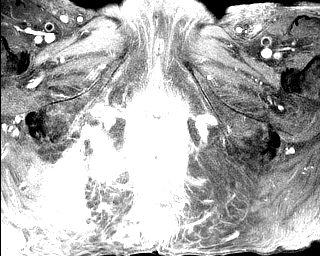
[im 16/24]
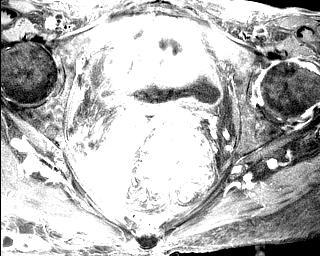
[im 24/24]
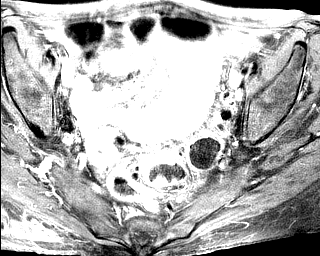

[Series 10: T1 fat-sat post-contrast · sagittal · 3.0mm · 0.72mm/px · 1 of 44 slices shown (2 of 2)]
[im 1/44]
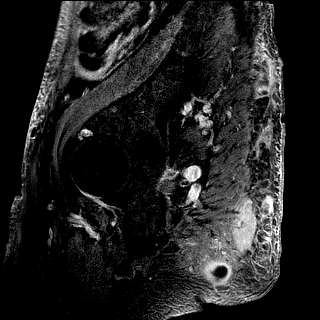

[35 of 48 positions shown; findings below may reference images not displayed]

DIAGNOSTIC STUDIES

EXAM

MRI of the pelvis with without contrast.

INDICATION

Rectal cancer
RECTAL CANCER, RECENT DIAGNOSIS, PT HAVING A LOT OF PAIN DURING SCAN, UNABLE TO HOLD STILL DUE TO
PAIN, 15ML GADAVIST

TECHNIQUE

Sagittal coronal and axial images were obtained with variable T1 and T2 weighting before after
administration of contrast.

COMPARISONS

CT abdomen pelvis December 15, 2019

FINDINGS

Exam is compromised due to patient motion. There is a large infiltrative mass occupying the
inferior right hemipelvis which is likely arising from the distal rectum. The mass narrows the lumen
of the rectum and extends to involve the right ischial rectal and ischial anal fossa is with
extension posteriorly below the sacrum and probable involvement of the medial right gluteal
musculature. Unfortunately exam is technically limited due to motion. No obvious osseous involvement
is seen. Mass likely involves posterior vaginal cuff although this is difficult to state with
certainty. Prior hysterectomy.

IMPRESSION

Technically limited exam.

There is a large infiltrative mass within the central and right lateral pelvis. In total, the mass
measures 9.3 cm transverse 9 cm craniocaudad, and 8.4 cm AP. Mass extends into the right ischial
rectal fossa and posteriorly to just below the coccyx and along the right medial gluteal
musculature. Some of this may reflect inflammatory change and abscess depending on symptoms. There
is probable invasion of the posterior vaginal cuff. The patient is status post hysterectomy.

Tech Notes:

RECTAL CANCER, RECENT DIAGNOSIS, PT HAVING A LOT OF PAIN DURING SCAN, UNABLE TO HOLD STILL DUE TO
PAIN, 15ML GADAVIST

## 2020-05-03 IMAGING — CT ABDOMEN_PELVIS W(Adult)
2 of 3 series · 11 of 46 positions shown, 12 images · non-contrast
Comparison: none

[Series 2: abdomen_pelvis ax 3.00 br40 s3 · axial · 0.44mm/px · z∈[+1365,+1703]mm · 8 of 131 slices shown, 9 images]
[im 9/131  soft-tissue]
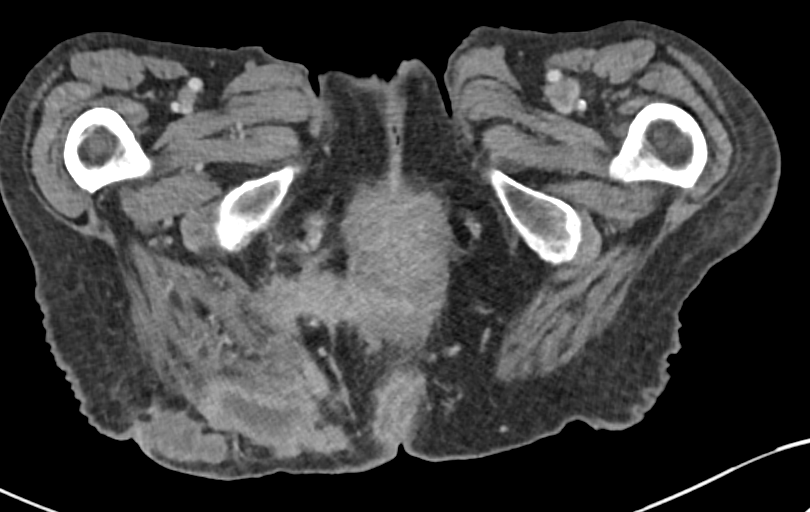
[im 9/131  bone]
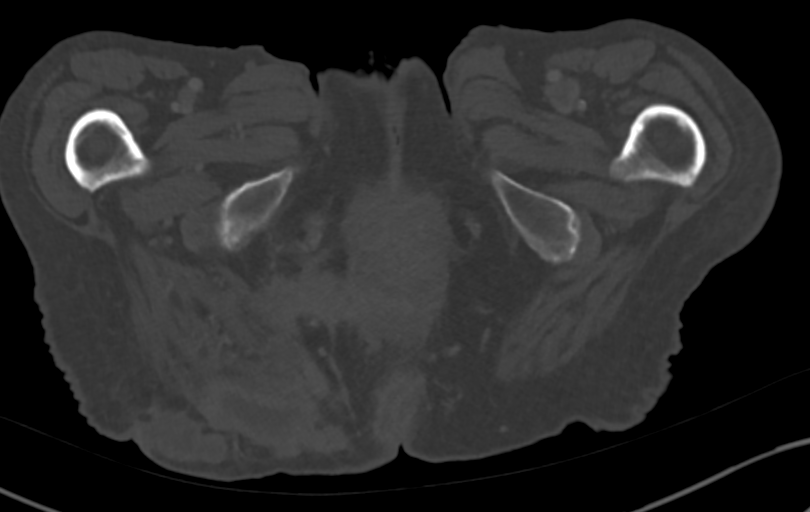
[im 26/131  soft-tissue]
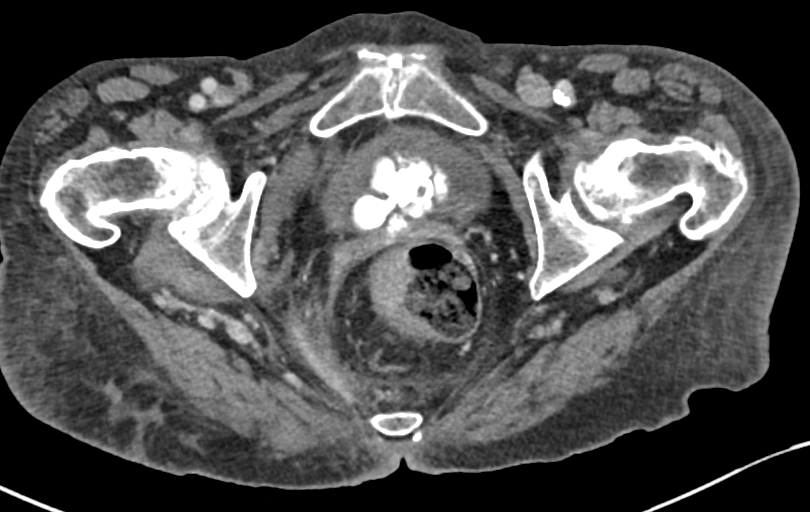
[im 42/131  soft-tissue]
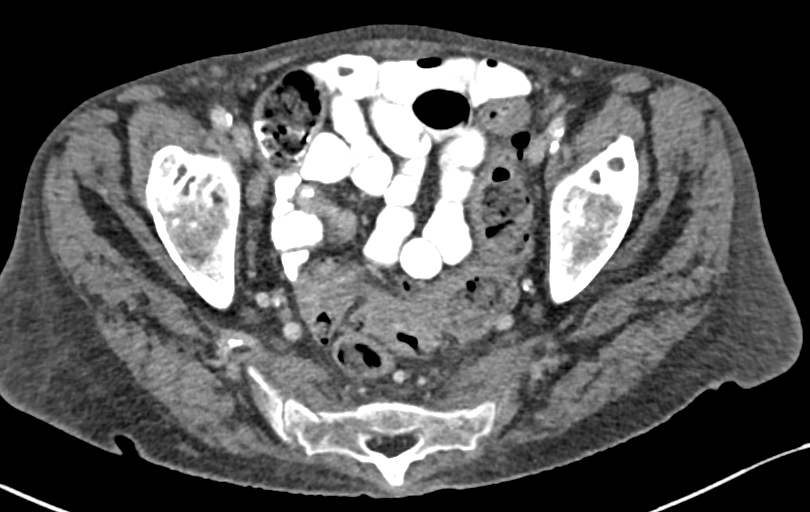
[im 59/131  soft-tissue]
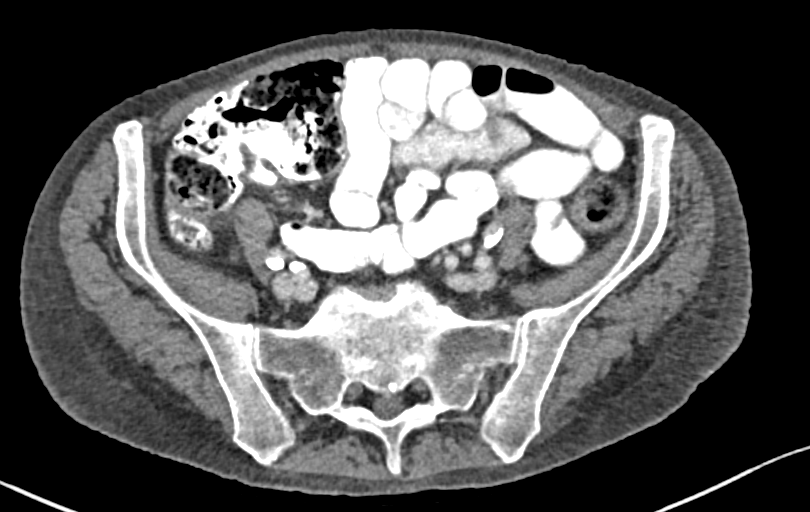
[im 72/131  soft-tissue]
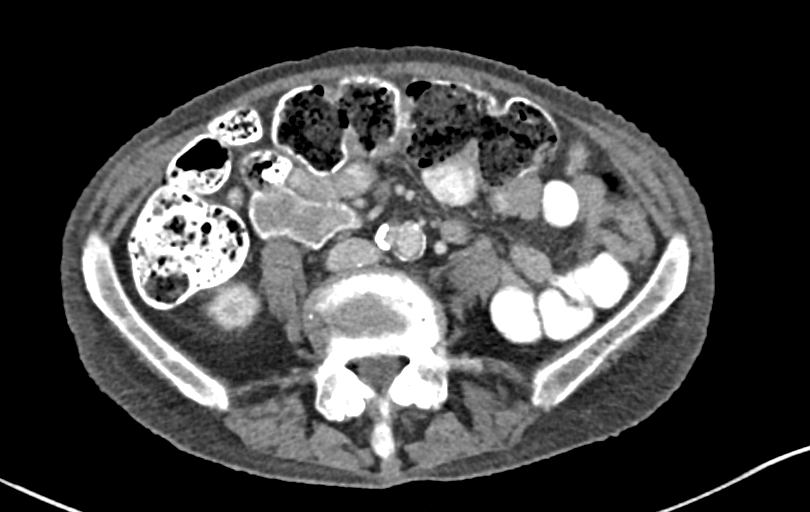
[im 89/131  soft-tissue]
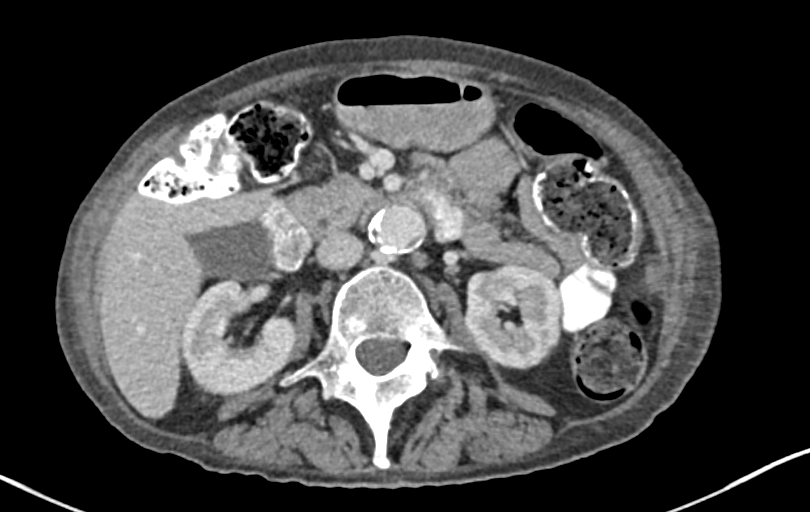
[im 105/131  soft-tissue]
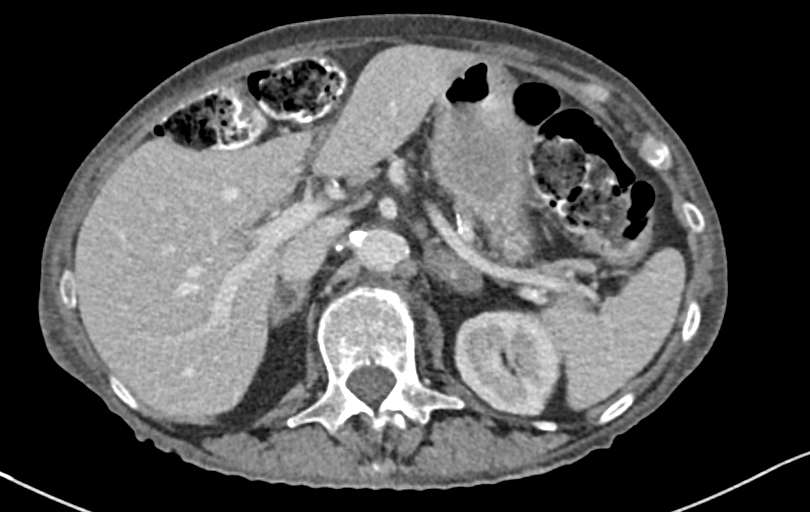
[im 122/131  soft-tissue]
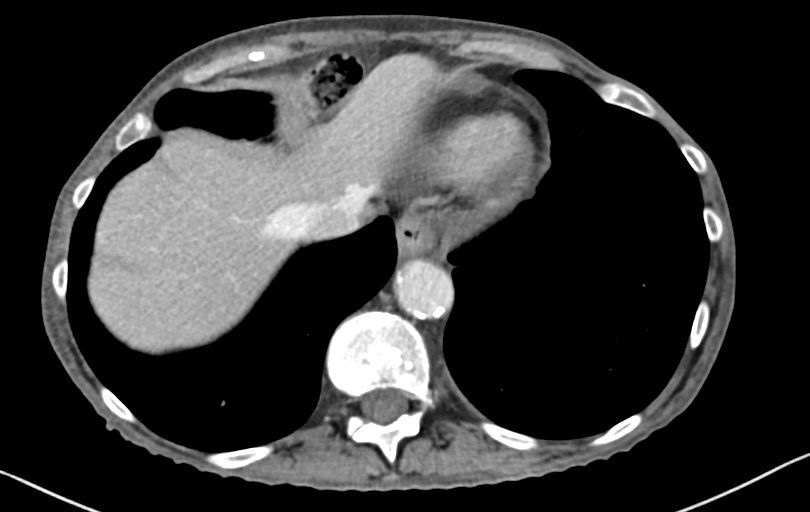

[Series 4: abdomen_pelvis cor 3.00 br40 s3 · coronal · 0.69mm/px · 3 of 74 slices shown]
[im 25/74  soft-tissue]
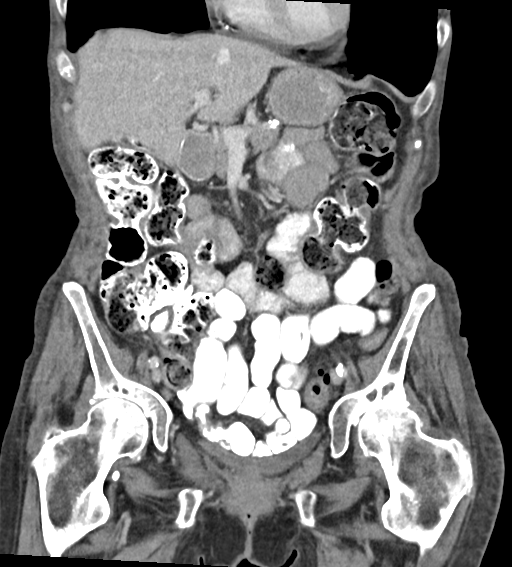
[im 33/74  soft-tissue]
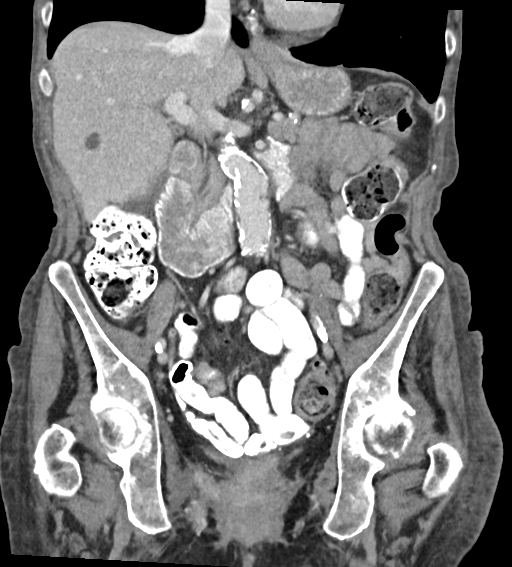
[im 41/74  soft-tissue]
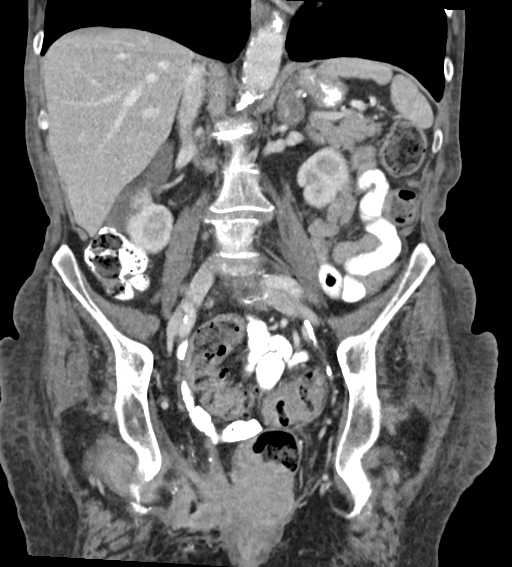

[11 of 46 positions shown; findings below may reference images not displayed]

DIAGNOSTIC STUDIES

EXAM

CT abdomen pelvis with contrast.

INDICATION

restaging rectal cancer
Restaging rectal cancer. Creat: 0.72. GFR: 52. [id number] 944I4 used via IV. ME/CF

TECHNIQUE

All CT scans at this facility use dose modulation, iterative reconstruction, and/or weight based
dosing when appropriate to reduce radiation dose to as low as reasonably achievable.

Number of previous computed tomography exams in the last 12 months is 1.

Number of previous nuclear medicine myocardial perfusion studies in the last 12 months is 0  .

COMPARISONS

December 15, 2019

FINDINGS

Mild scarring is noted within the lung bases. Minimal pericardial fluid is noted. Small hiatal
hernia is evident.

Hepatic cyst is re-demonstrated. The remaining liver is unremarkable. Gallbladder is normal. Spleen
is unremarkable.

Low-density bilateral adrenal adenomas are evident. Right renal cyst is noted. Small left renal
cyst is also seen.

Pancreas is unremarkable. No retroperitoneal adenopathy. Calcified plaque is noted throughout the
abdominal pelvic vasculature. There is ectasia of the aorta.

Moderate retained fecal material is noted throughout the colon. There is colonic diverticulosis
without diverticulitis. Normal appendix. No ascites or mesenteric adenopathy.

Large infiltrate soft tissue mass is again evident along the lower rectum where there is extension
into the right ischial anal and ischial rectal fossa. Mass involves the right obturator extend is
through the sciatic notch. Low-density foci can be identified along the medial margin of the right
gluteus musculature image 120 series 2 as well as within the right gluteal subcutaneous fat. Again
evident is probable fistula extending to the right image 122 series 2.

The dominant central aspect of the mass measures 9 x 7 cm compared to 9.3 x 8.8 cm previously image
114 series 2.

Osseous structures are unchanged.

IMPRESSION

Large infiltrative right-sided pelvic mass is re-demonstrated consistent with reported rectal
carcinoma. Again this extends to involve the right ischial anal/rectal fossa, right gluteal
musculature and right pelvic sidewall. Probable associated fistula is noted and there is tumor
involvement of the subcutaneous fat of the right gluteal region and along the gluteal cleft. The
central aspect of the tumor is minimally diminished in size when compared to prior exam.

No adenopathy is noted throughout the remaining abdomen or pelvis or evidence for additional
metastatic disease.

Bilateral adrenal adenomas noted.

Tech Notes:

Restaging rectal cancer. Creat: 0.72. GFR: 52. [id number] [id number] used via IV. ME/CF

## 2020-05-17 IMAGING — CT TRAUMA_HIP_PELVIS(Adult)
2 of 4 series · 13 of 46 positions shown, 15 images · non-contrast
Comparison: CT hip dated 05/03/2020.

DIAGNOSTIC STUDIES

EXAM:  CT RIGHT LOWER EXTREMITY WITHOUT INTRAVENOUS CONTRAST, HIP  (62611)
INDICATION: ulcer right buttock, pain out of proportion PT HAS KNOWN HX OF RECTAL CANCER, PT HAS
STAGE [DATE] ON RIGHT BUTTOCK. PT IS IN EXTREME PAIN CTDI 6.61 TR
TECHNIQUE: Axial computed tomography images of the right hip without intravenous contrast.
Sagittal and coronal reformatted images were created and reviewed.

[Series 2: hip ax 3.00 br60 s3 · axial · 0.37mm/px · z∈[+1258,+1456]mm · 10 of 74 slices shown, 12 images]
[im 6/74  soft-tissue]
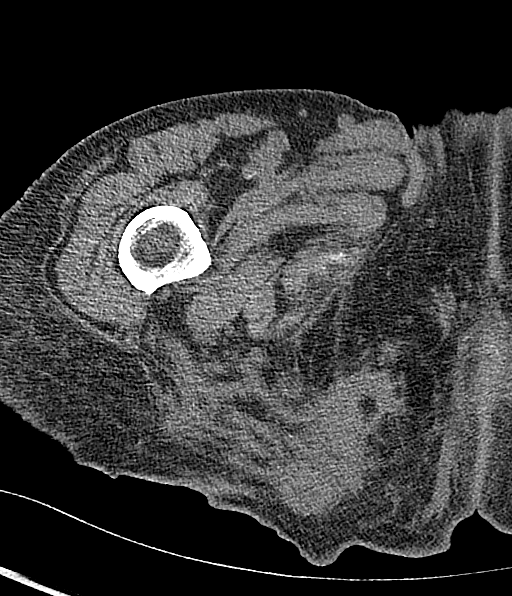
[im 6/74  bone]
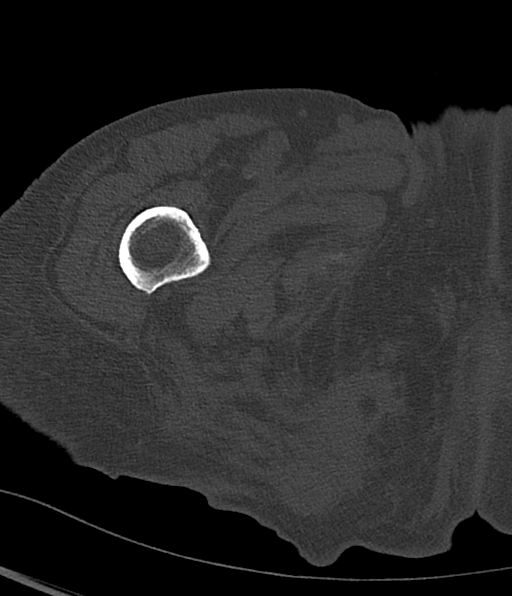
[im 14/74  soft-tissue]
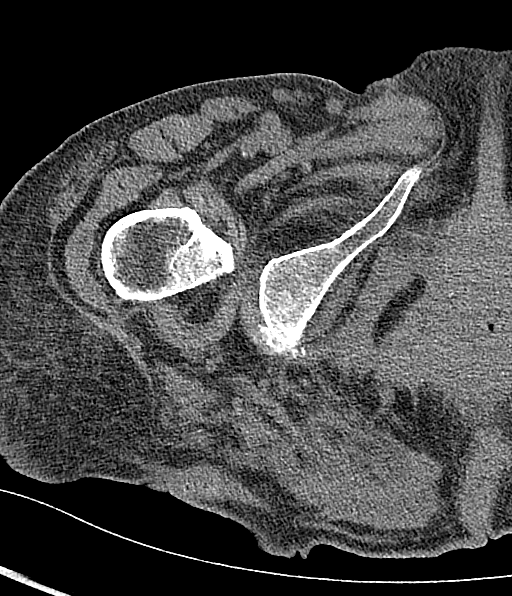
[im 19/74  soft-tissue]
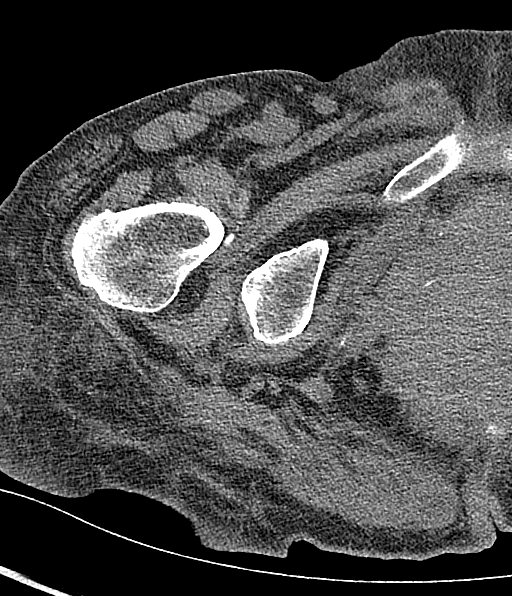
[im 27/74  soft-tissue]
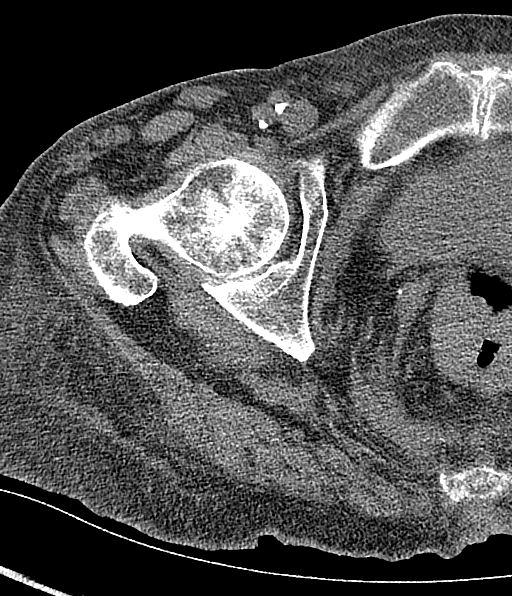
[im 34/74  soft-tissue]
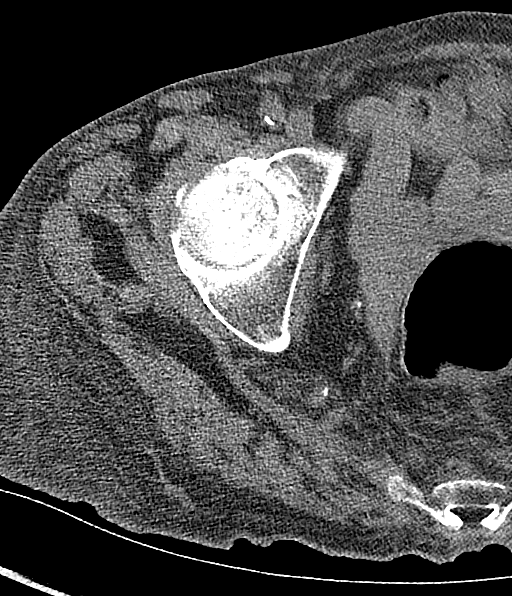
[im 40/74  soft-tissue]
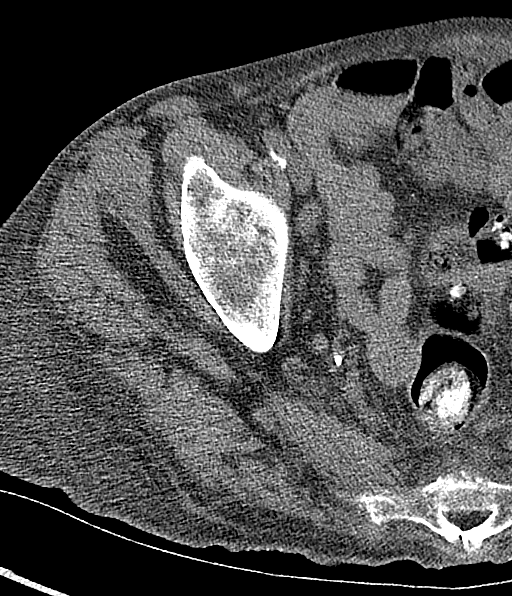
[im 47/74  soft-tissue]
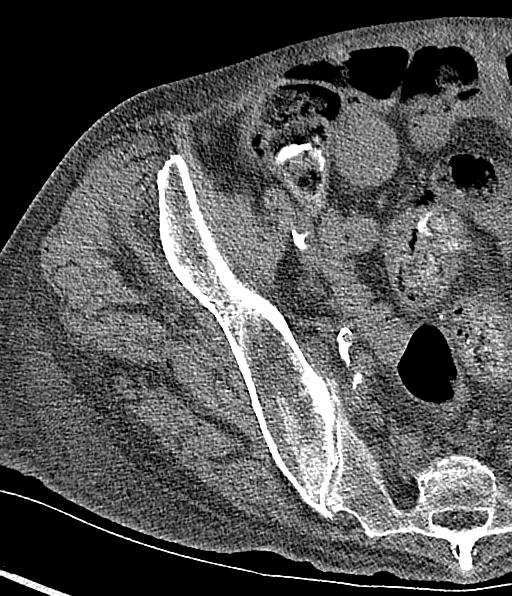
[im 55/74  soft-tissue]
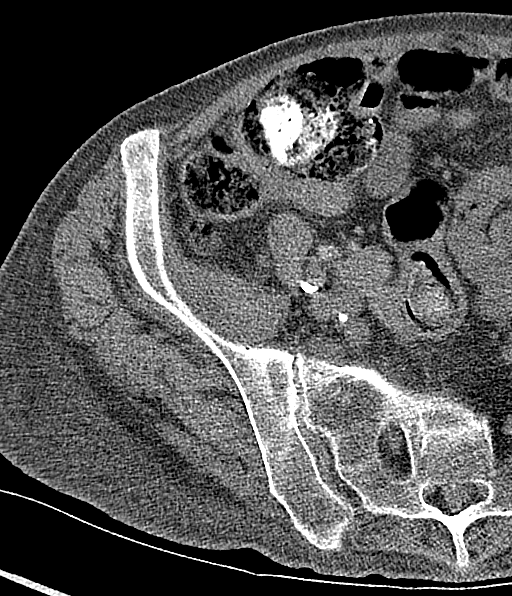
[im 60/74  soft-tissue]
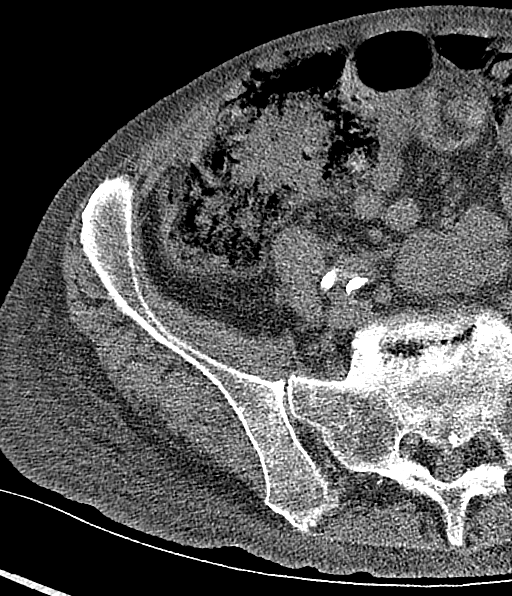
[im 60/74  bone]
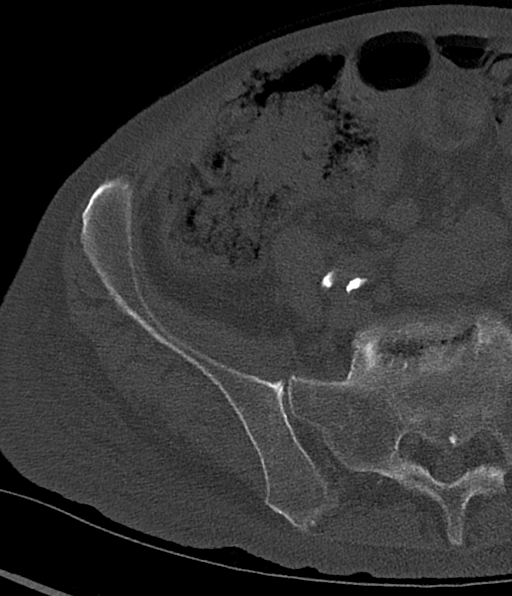
[im 68/74  soft-tissue]
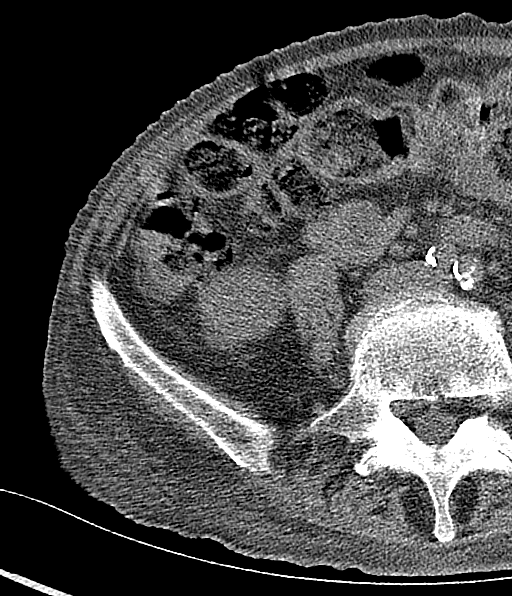

[Series 4: hip cor 3.00 br60 s3 · coronal · 0.37mm/px · 3 of 54 slices shown]
[im 18/54  soft-tissue]
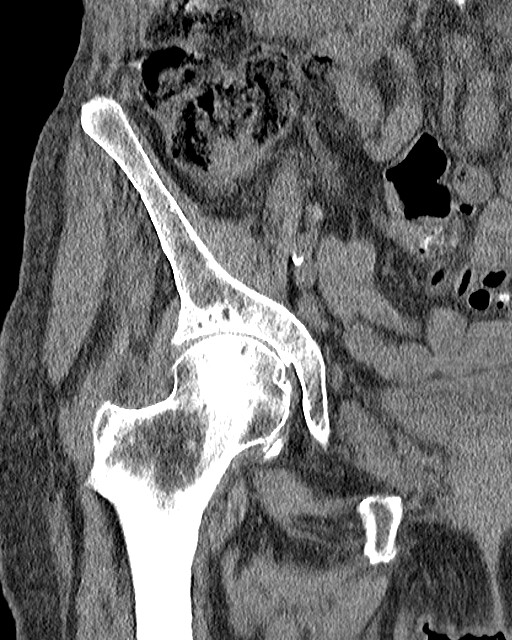
[im 24/54  soft-tissue]
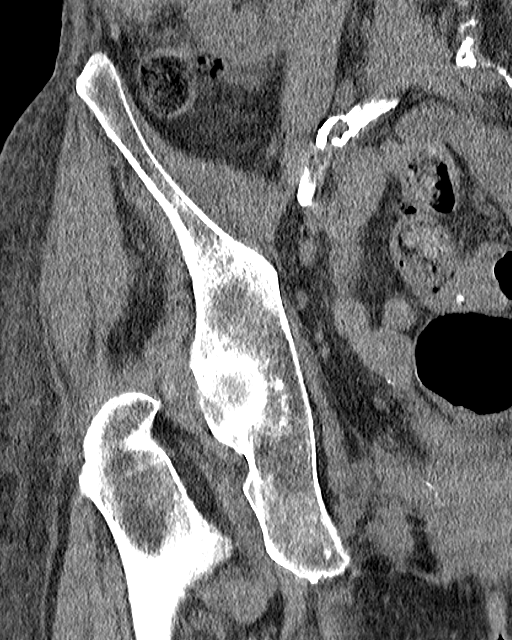
[im 30/54  soft-tissue]
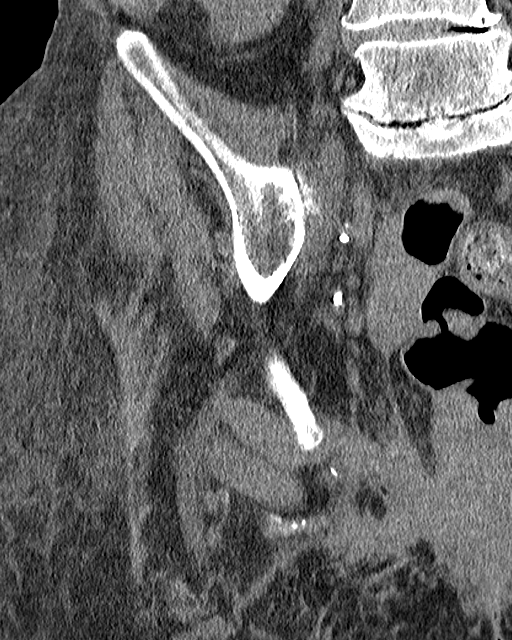

[13 of 46 positions shown; findings below may reference images not displayed]

All CT scans at this facility use dose modulation, interval reconstruction, and/or weight-based
dosing when appropriate to reduce radiation dose to as low as reasonably achievable.

Number of previous computed tomography exams in the last 12 months is 0.  Number of previous
nuclear medicine myocardial perfusion studies in the last 12 months is 0.
FINDINGS: BONES/JOINTS:  NO acute fractures. NO dislocations.

SOFT TISSUES:  Moderate edema and/or inflammatory changes in the subcutaneous soft tissue from the
RIGHT flank into the buttock and proximal thigh areas.

Likely area of ulceration in the RIGHT buttock area. The previously adjacent abscess is
significantly decreased. There is a persistent collection medial to this area measuring
approximately 4.3 cm in greatest dimension transversely. This previously measured approximately
cm.

Persistent marked thickening in the RIGHT parapelvic/perirectal fascia and soft tissues.
IMPRESSION: - Likely area of ulceration in the RIGHT buttock area. The previously adjacent abscess is
significantly decreased. There is a persistent collection medial to this area measuring
approximately 4.3 cm in greatest dimension transversely. This previously measured approximately
cm.  This is consistent with multilocular abscess and cutaneous fistula. The inferior margin of this
collection is not demonstrated on this examination.

- Persistent marked thickening in the RIGHT parapelvic/perirectal fascia and soft tissues.  This
appears increased compared with prior study. The differential includes localized metastasis and/or
infectious and/or inflammatory changes. Specific direct connection to the collections in the RIGHT
buttock area is not identified.

- Moderate edema and/or inflammatory changes in the subcutaneous soft tissue from the RIGHT flank
into the buttock and proximal thigh areas.  This is increased compared with prior study.

If further evaluation is felt to be indicated, follow-up could be evaluated with fistulogram via
the the soft tissue ulceration in the RIGHT buttock area.

Tech Notes:

PT HAS KNOWN HX OF RECTAL CANCER, PT HAS STAGE [DATE] ON RIGHT BUTTOCK.  PT IS IN EXTREME PAIN
TR

## 2020-05-18 IMAGING — CR [ID]
1 series · 1 of 1 positions shown · non-contrast
Comparison: none

[chest ap]
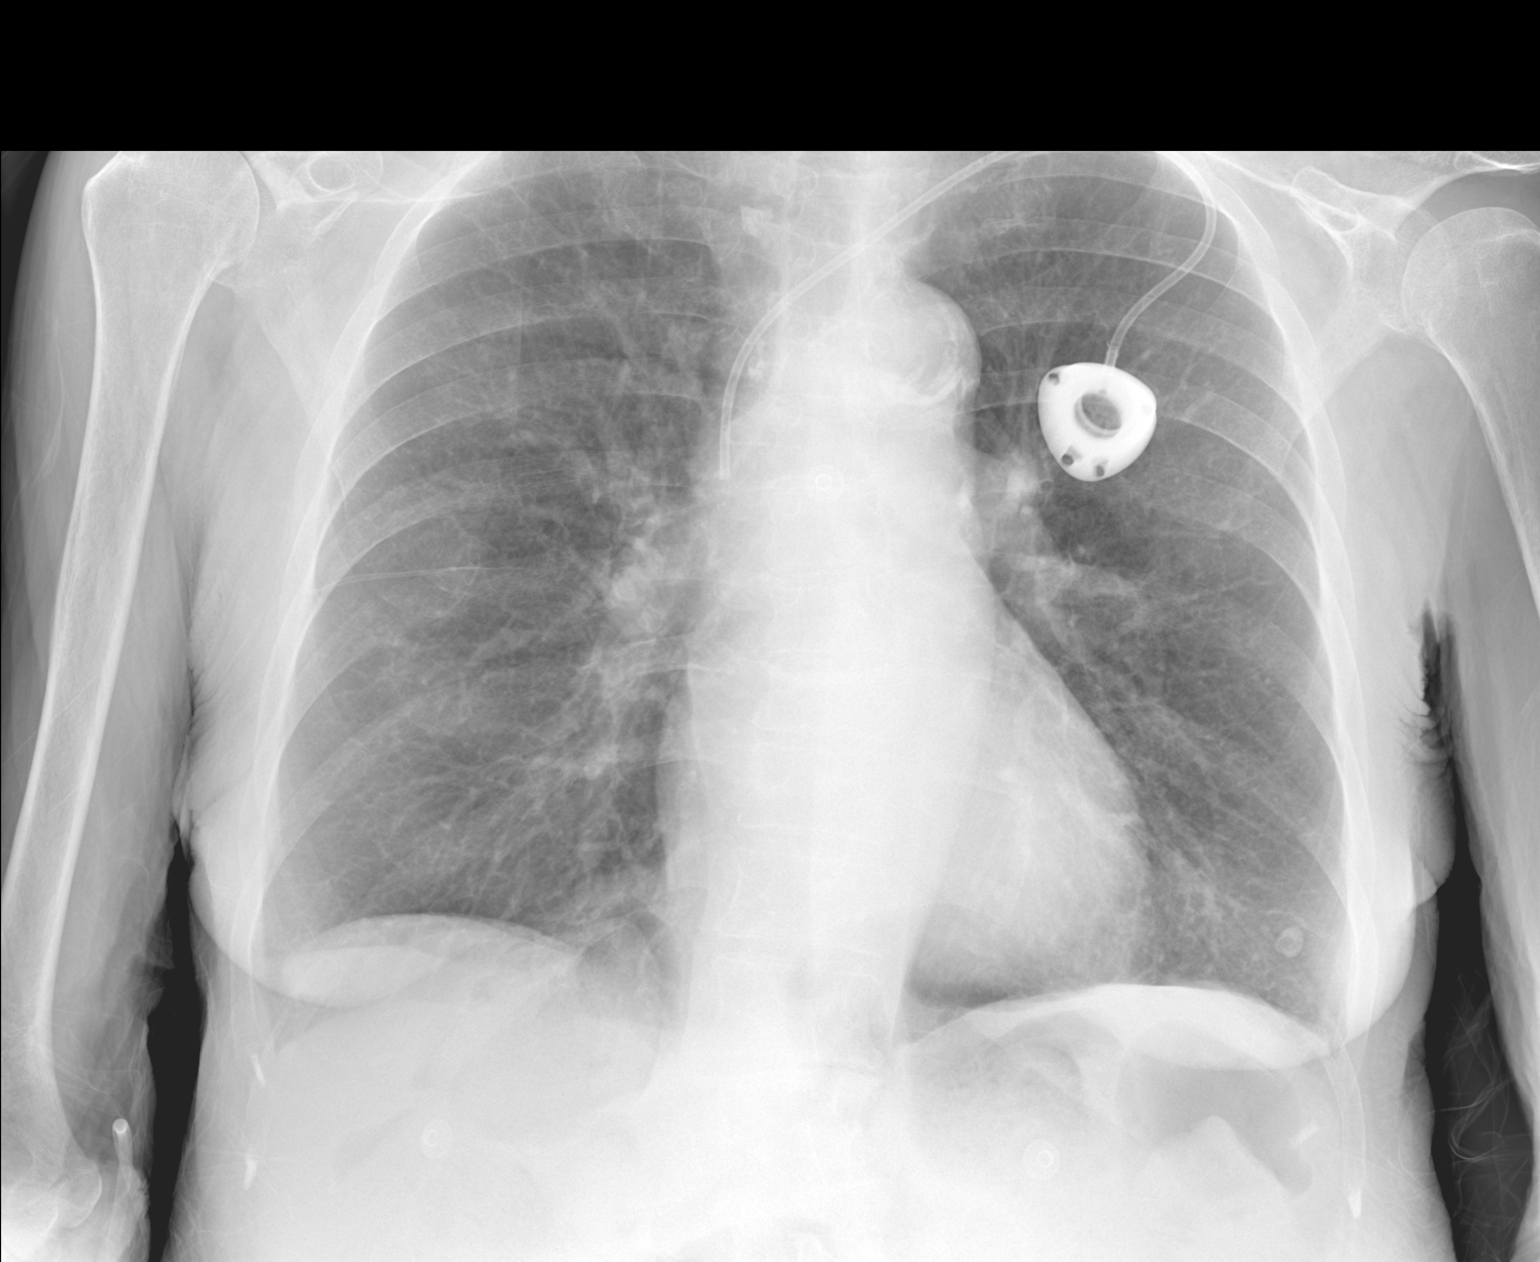

[1 of 1 positions shown; findings below may reference images not displayed]

DIAGNOSTIC STUDIES

EXAM

XR chest 1V

INDICATION

fever
CHEST PAIN, BACK PAIN, FEVER, ACUTE ONSET, UNDERGOING CHEMO DUE TO COLORECTOL CA.

TECHNIQUE

AP chest.

COMPARISONS

February 18, 2020

FINDINGS

Cardiac silhouette is unchanged. Left-sided central venous catheter is in place. Small left basilar
granuloma is seen. There is minimal prominence of the perihilar markings which actually is improved
from prior exam. No airspace consolidation is seen. Osseous structures are stable.

IMPRESSION

Minimally prominent perihilar markings which are slightly improved compared to prior exam.

Tech Notes:

CHEST PAIN, BACK PAIN, FEVER, ACUTE ONSET, UNDERGOING CHEMO DUE TO COLORECTOL CA.

## 2020-06-07 IMAGING — CT PE(Adult)
1 of 3 series · 14 of 31 positions shown · non-contrast
Comparison: none

[Series 4: cta pulmonary ax 2.00 bv36 s3 · axial · 0.47mm/px · z∈[+1883,+2113]mm · 14 of 125 slices shown]
[im 5/125  lung]
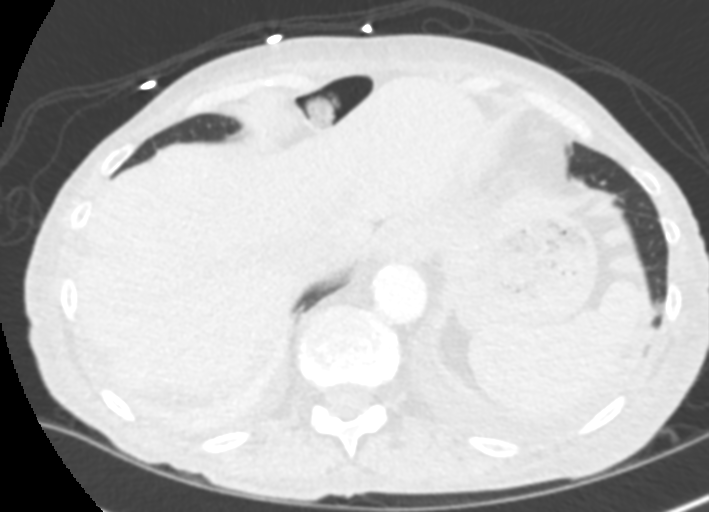
[im 15/125  mediastinal]
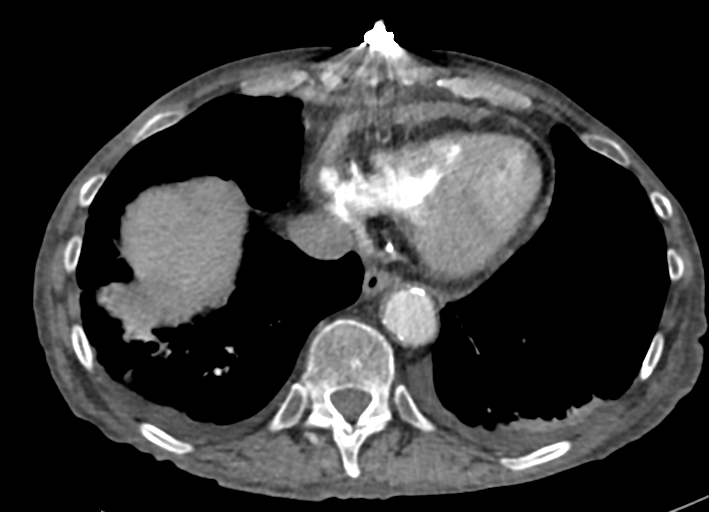
[im 25/125  lung]
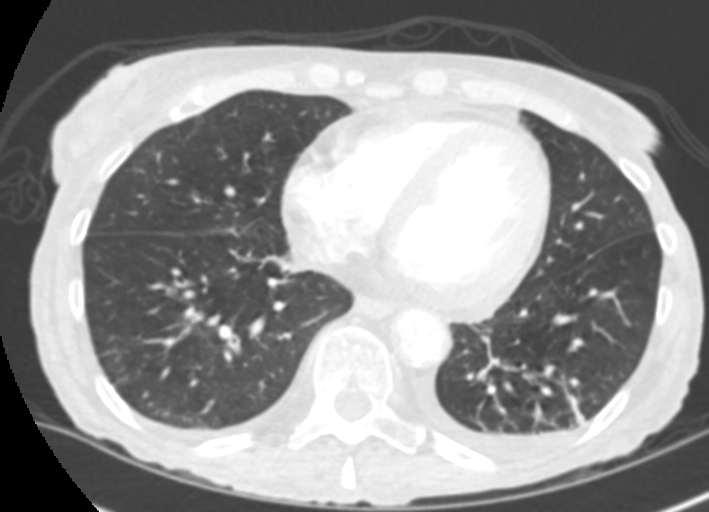
[im 35/125  mediastinal]
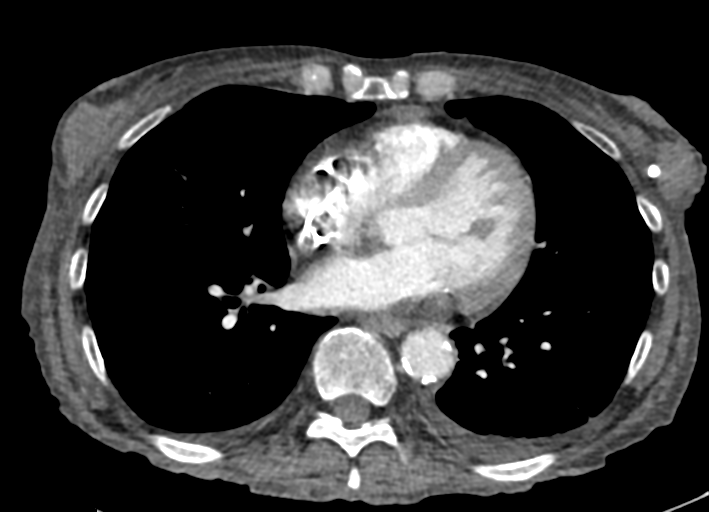
[im 42/125  lung]
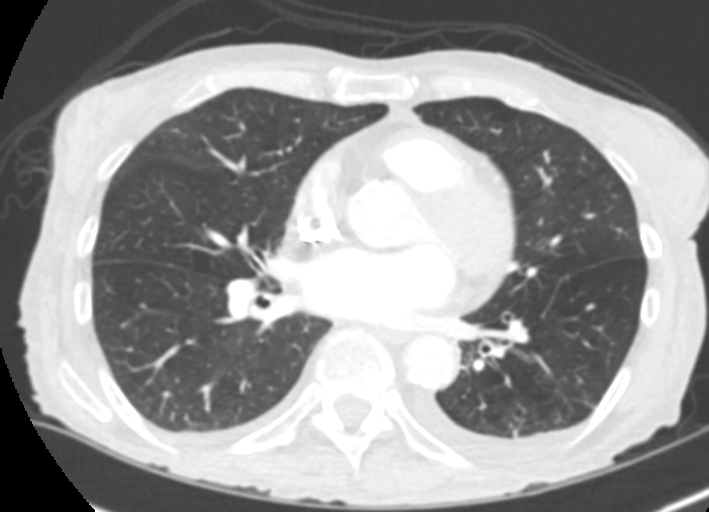
[im 46/125  mediastinal]
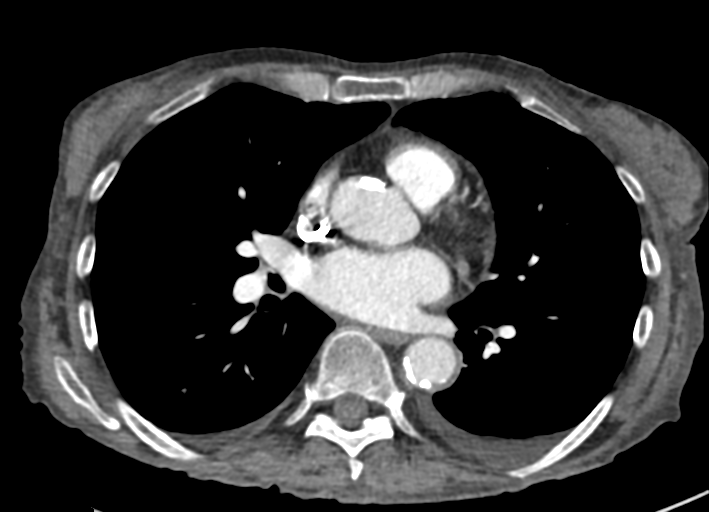
[im 55/125  lung]
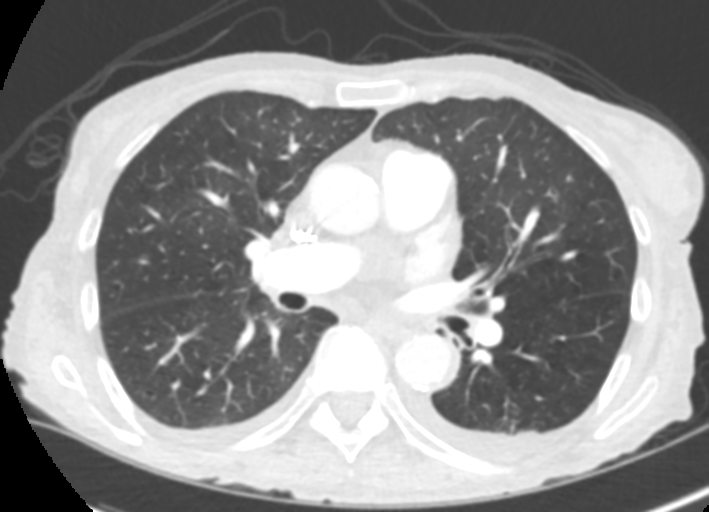
[im 65/125  mediastinal]
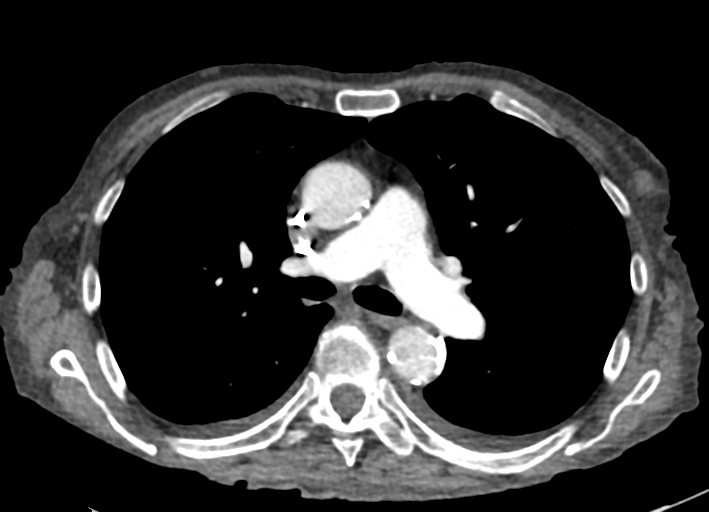
[im 75/125  lung]
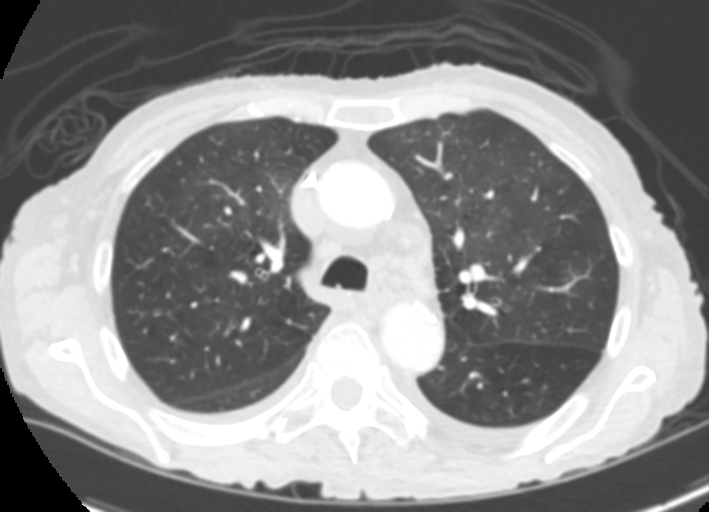
[im 83/125  mediastinal]
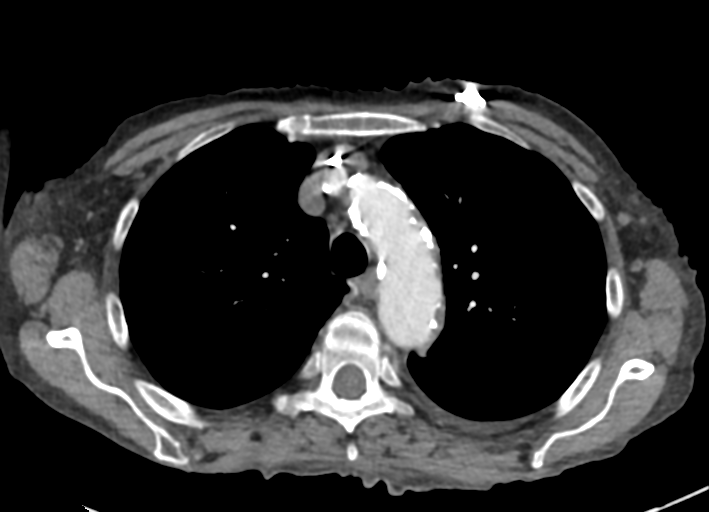
[im 90/125  lung]
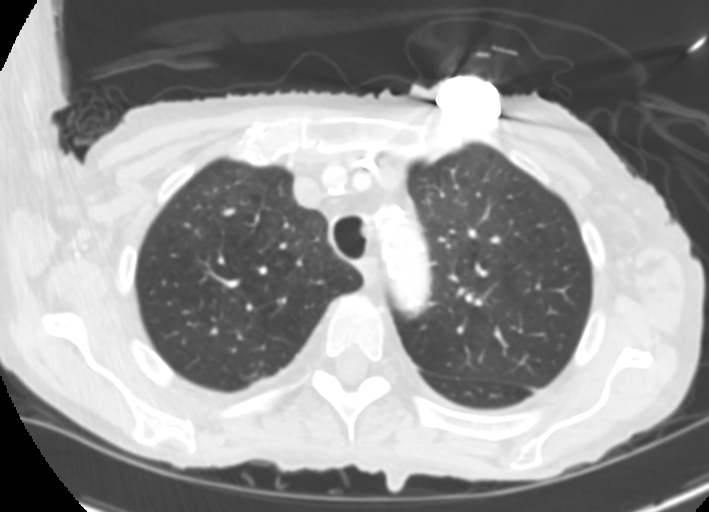
[im 100/125  mediastinal]
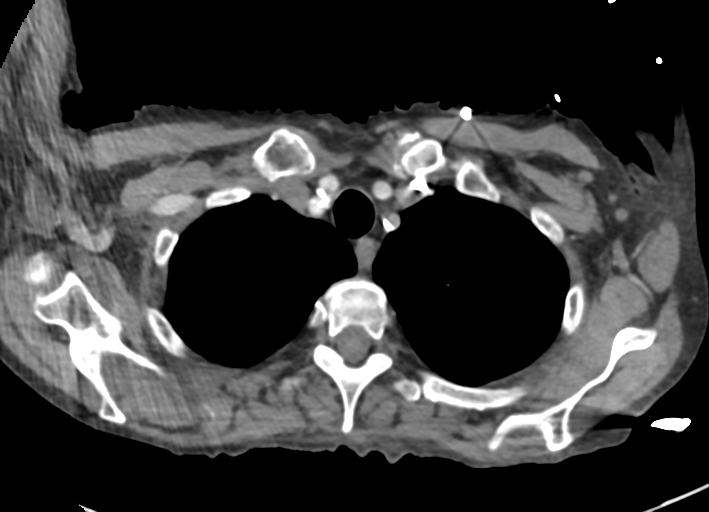
[im 110/125  lung]
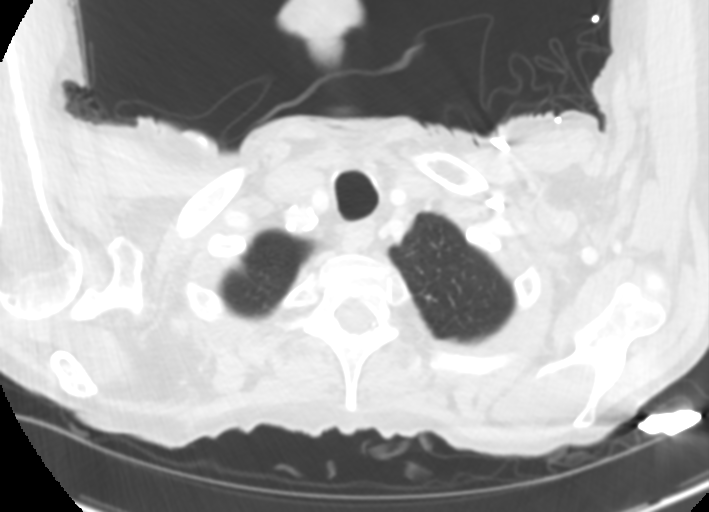
[im 120/125  mediastinal]
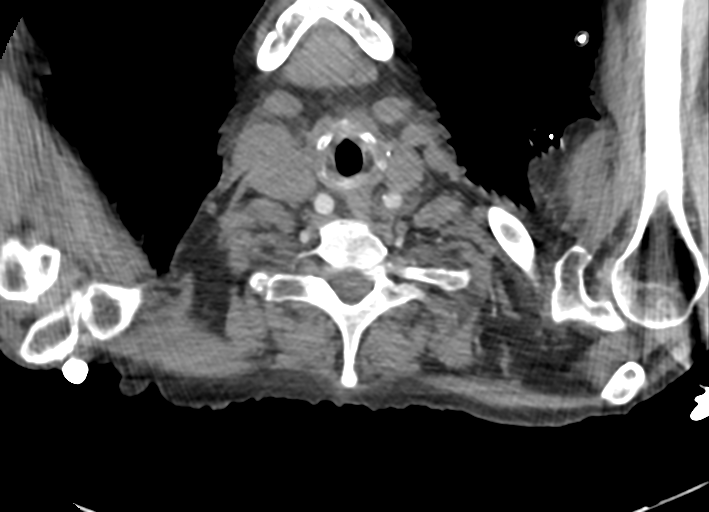

[14 of 31 positions shown; findings below may reference images not displayed]

DIAGNOSTIC STUDIES

EXAM

INDICATION

pulmonary [place]
Pt c/o shortness of breath and pain on inspiration. H/o COPD and metastatic adenocarcinoma. Creat:
.81. GFR: 54.8. [id number] 75ML used via port. CT/NM 2/0. CF/MS/BM

TECHNIQUE

All CT scans at this facility use dose modulation, iterative reconstruction, and/or weight based
dosing when appropriate to reduce radiation dose to as low as reasonably achievable.

Number of previous computed tomography exams in the last 12 months is 0  .

Number of previous nuclear medicine myocardial perfusion studies in the last 12 months is 0  .

COMPARISONS

None

FINDINGS

No CT evidence of pulmonary embolism. No right ventricular strain or pulmonary arterial
hypertension

No supraclavicular or axillary lymphadenopathy

No mediastinal or hilar lymphadenopathy. Normal cardiac size. No pericardial effusion. Heavy
coronary artery calcifications. Calcification of the thoracic aorta without aneurysm

Small bilateral pleural effusions. Bibasilar atelectasis. No focal consolidation

The visualized osseous structures are unremarkable

IMPRESSION

No CT evidence of pulmonary embolism

Small bilateral pleural effusions

Tech Notes:

Pt c/o shortness of breath and pain on inspiration. H/o COPD and metastatic adenocarcinoma. Creat:
.81. GFR: 54.8. [id number] 75ML used via port. CT/NM 2/0. CF/MS/BM

## 2021-03-08 IMAGING — CR [ID]
1 series · 1 of 1 positions shown · non-contrast
Comparison: none

[chest pa]
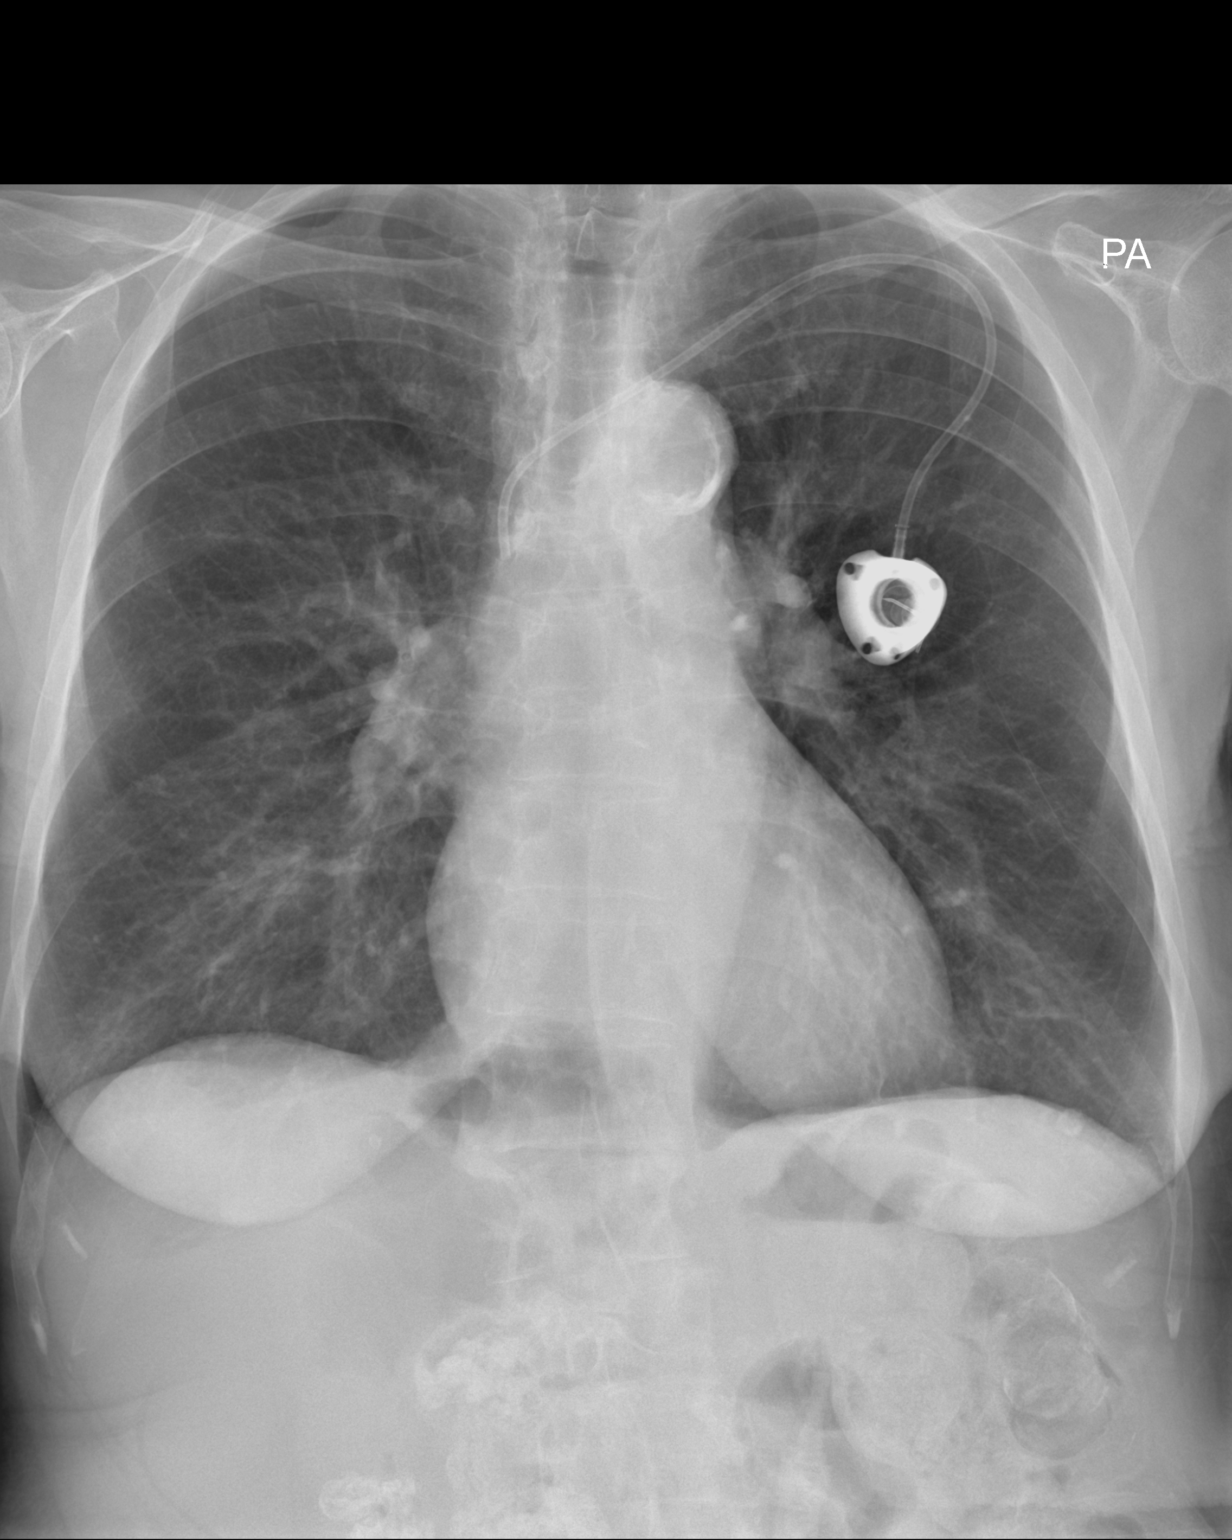

[1 of 1 positions shown; findings below may reference images not displayed]

DIAGNOSTIC STUDIES

EXAM

CR Chest 1 View

INDICATION

PA only for r/o pneumo
PA chest r/o pneumo

TECHNIQUE

Single view of the chest

COMPARISONS

None available at the time of dictation.

FINDINGS

Heart: Normal

Mediastinum/Vessels: Normal

Lungs/Pleural space: There is hyperexpansion of the lungs. No acute airspace opacity or
consolidation.

Bony thorax: No acute osseous abnormality.

There is a port line identified without evidence of complication.

IMPRESSION

Hyperexpansion of the lungs.

Tech Notes:

PA chest r/o pneumo

## 2021-03-08 IMAGING — CR [ID]
1 series · 1 of 1 positions shown · non-contrast
Comparison: none

[x chest ap]
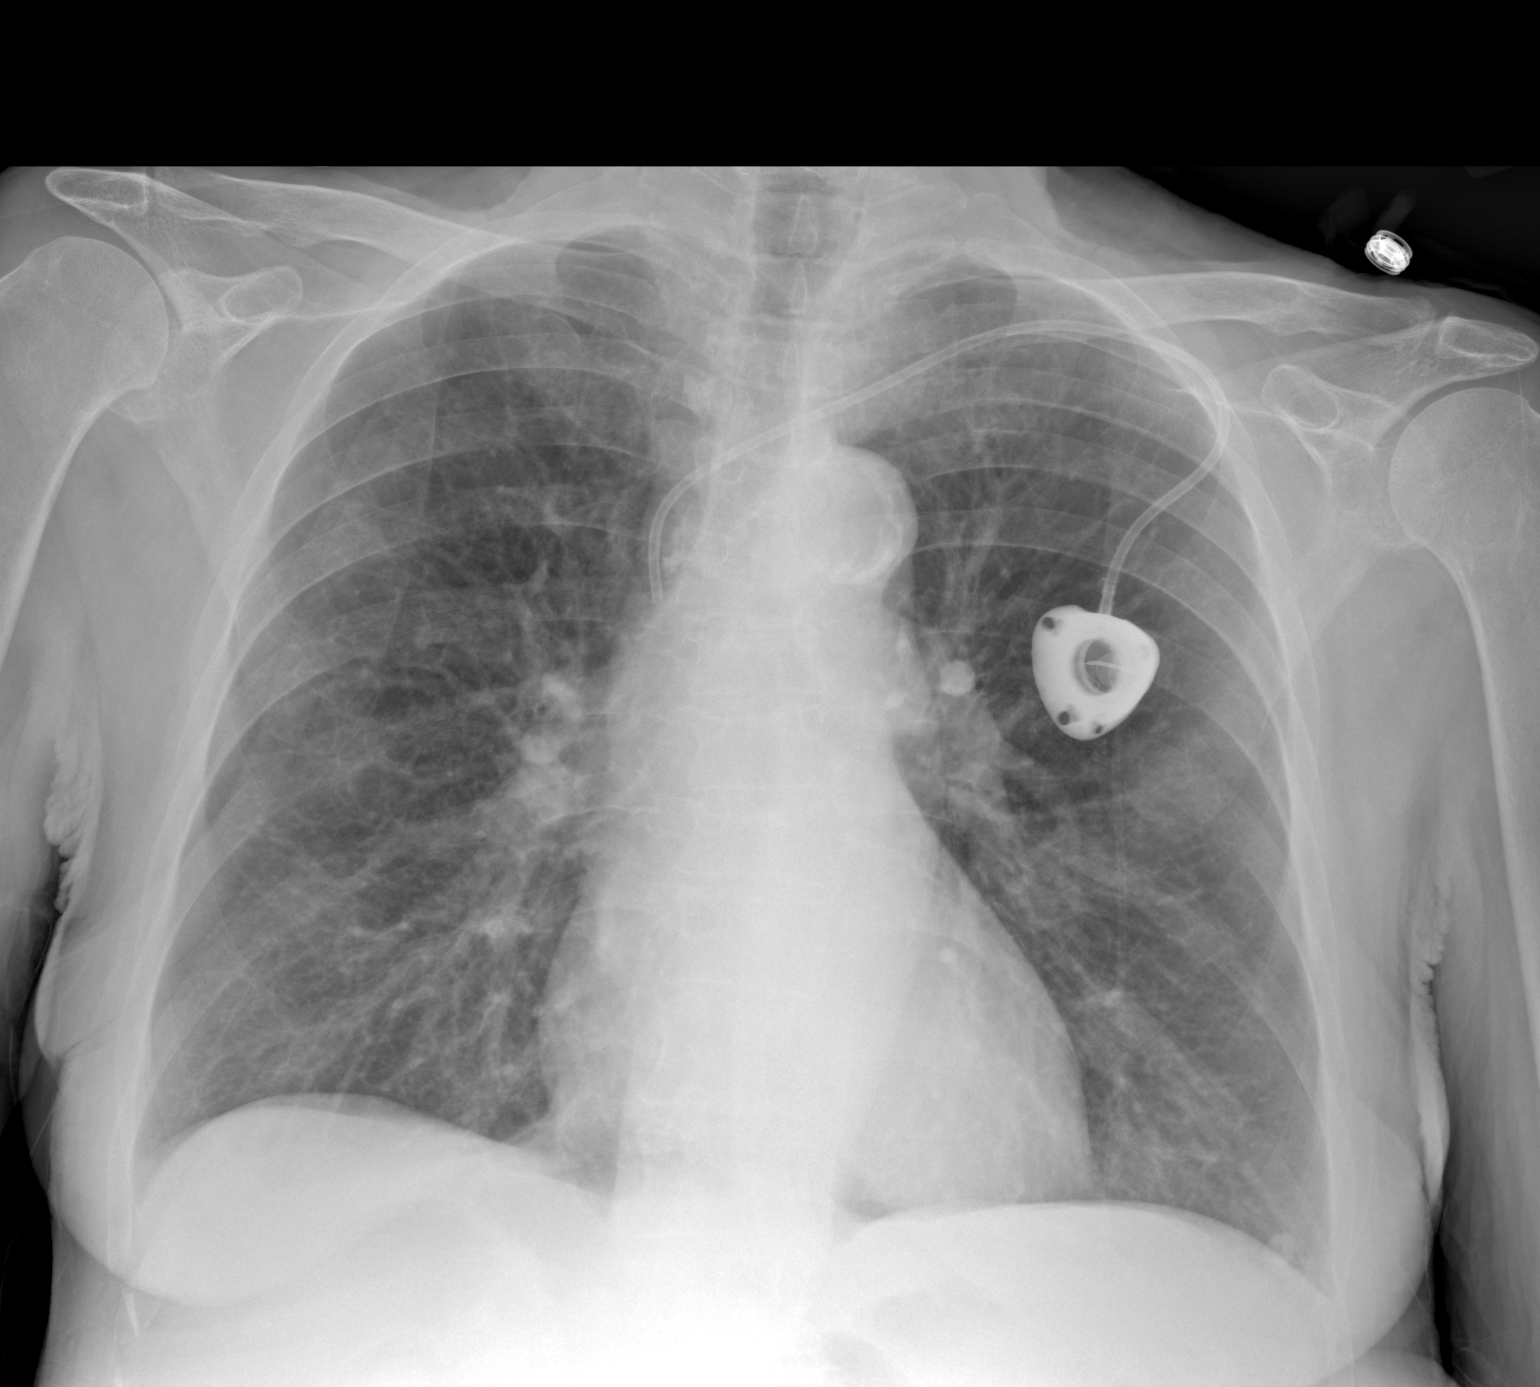

[1 of 1 positions shown; findings below may reference images not displayed]

DIAGNOSTIC STUDIES

EXAM

CR Chest 1 View

INDICATION

fever
fever,  hx copd

TECHNIQUE

Single view of the chest

COMPARISONS

May 18, 2020

FINDINGS

Heart: Normal

Mediastinum/Vessels: There is mild peribronchial thickening which is unchanged

Lungs/Pleural space: There is hyperexpansion of the lungs. There is a calcified nodule in the left
lower lobe which is unchanged. There are linear densities overlying the lateral right chest
consistent with overlying soft tissue densities.

Bony thorax: No acute osseous abnormality

IMPRESSION
1. Mild bilateral peribronchial thickening.
2. Hyperexpansion of the lungs.
3. There are linear densities overlying the lateral right chest most consistent  with overlying
soft tissue densities. A loculated pneumothorax is very unlikely but follow-up PA view of the chest
is recommended to confirm there is no pneumothorax.

Tech Notes:

fever,
hx copd

## 2021-04-04 IMAGING — MR Hips^ROUTINE
6 of 8 series · 31 of 48 positions shown · non-contrast
Comparison: none

[Series 2: T1 fat-sat · axial · 6.0mm · 0.74mm/px · z∈[-93,+116]mm · 8 of 29 slices shown]
[im 1/29]
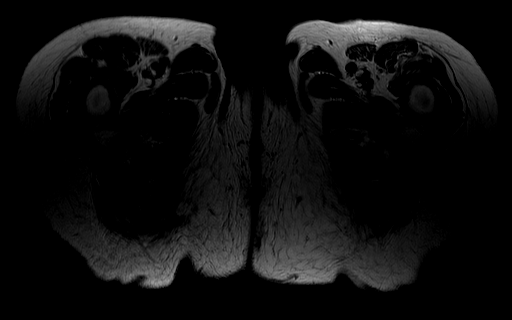
[im 5/29]
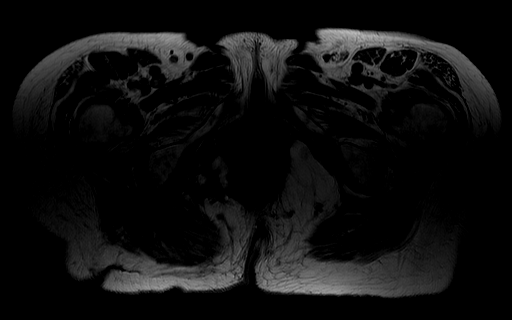
[im 9/29]
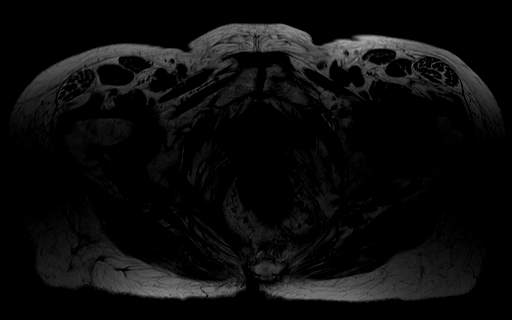
[im 13/29]
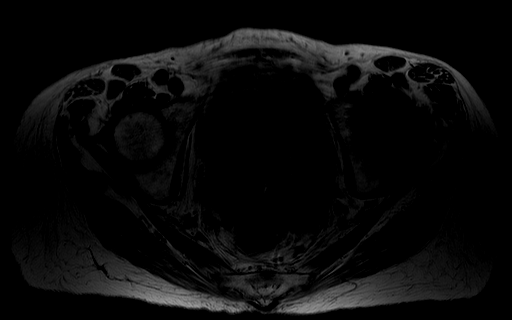
[im 17/29]
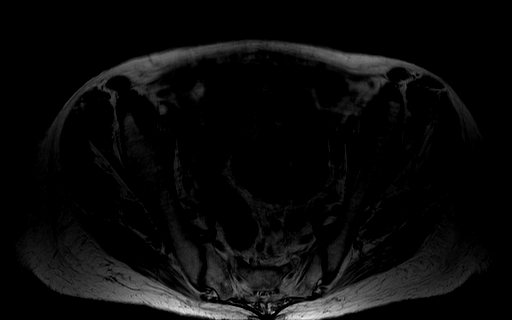
[im 21/29]
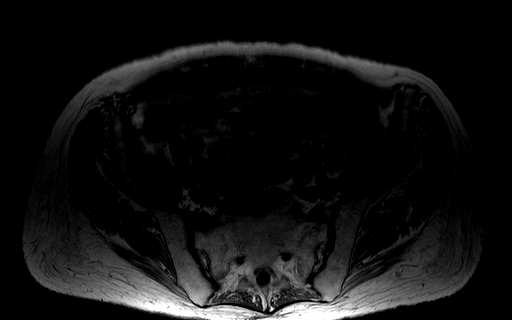
[im 25/29]
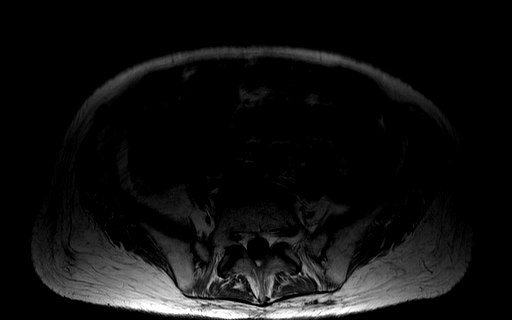
[im 29/29]
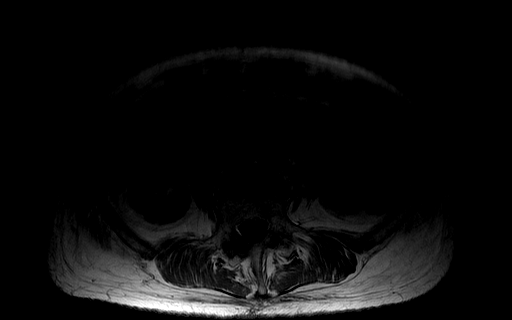

[Series 4: T1 · coronal · 4.0mm · 0.74mm/px · 5 of 20 slices shown]
[im 1/20]
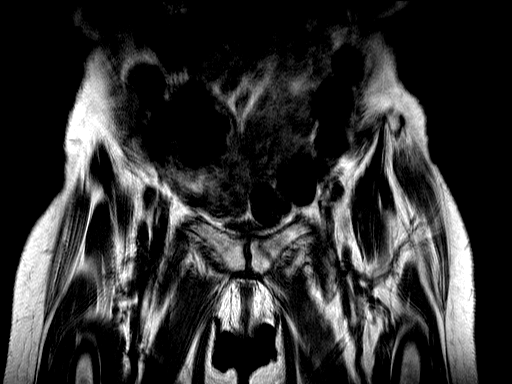
[im 5/20]
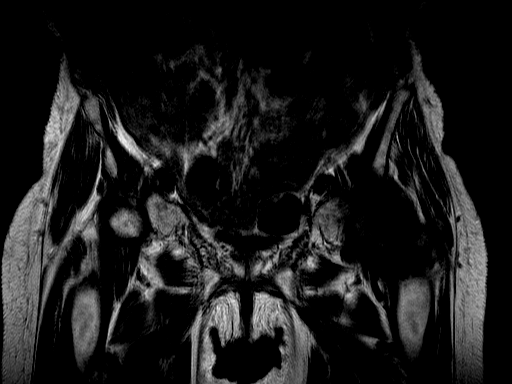
[im 10/20]
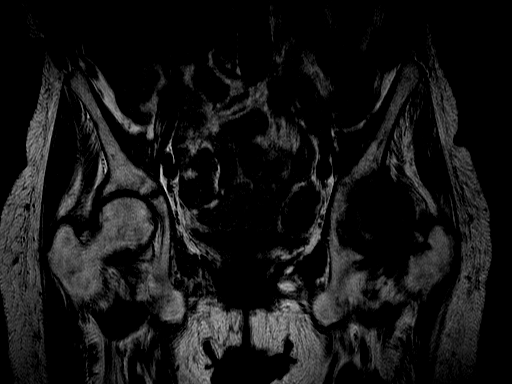
[im 15/20]
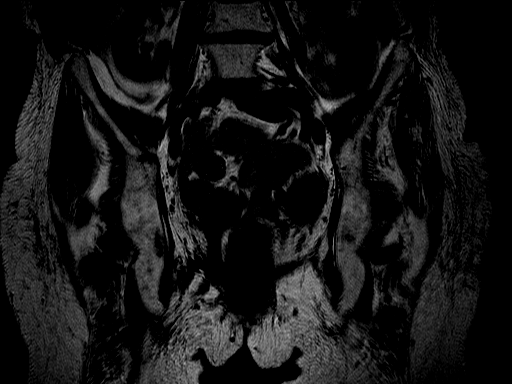
[im 20/20]
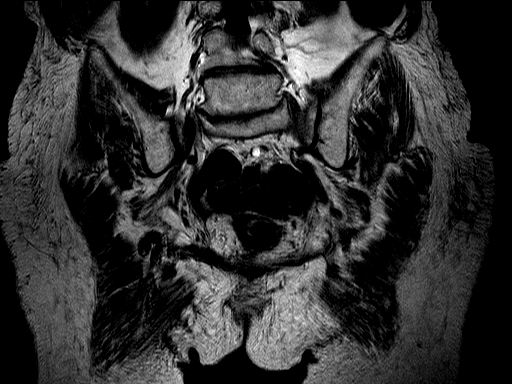

[Series 7: T2 fat-sat · axial · 5.0mm · 0.78mm/px · z∈[-115,+54]mm · 7 of 28 slices shown (1 of 2)]
[im 1/28]
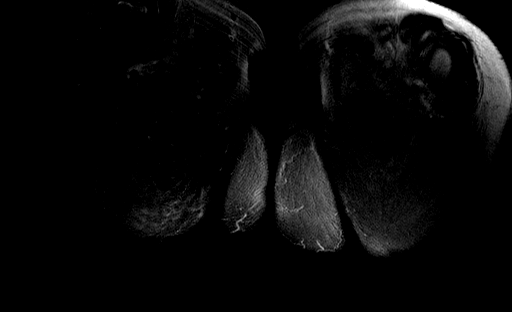
[im 5/28]
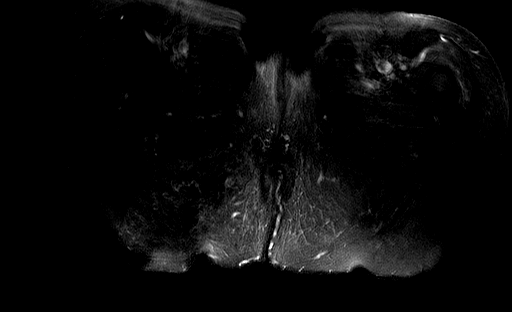
[im 10/28]
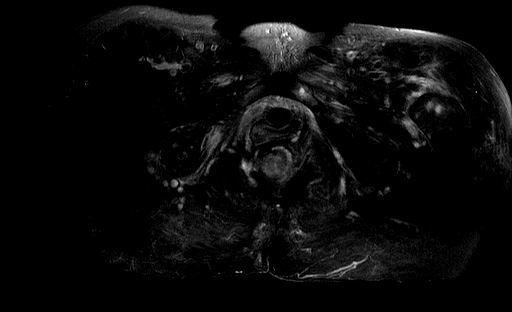
[im 14/28]
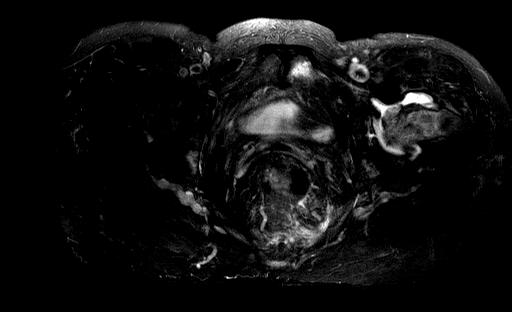
[im 19/28]
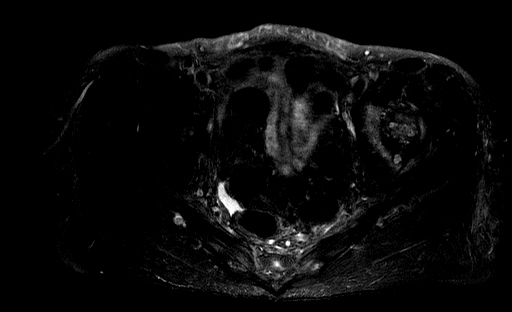
[im 23/28]
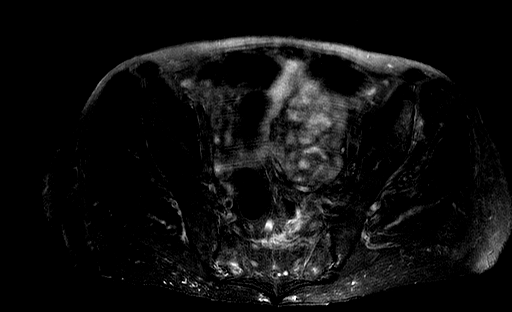
[im 28/28]
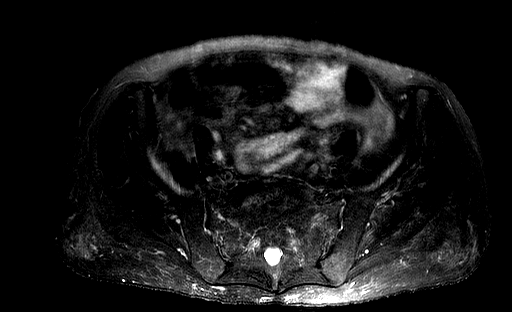

[Series 8: T2 fat-sat · sagittal · 4.0mm · 0.57mm/px · 4 of 17 slices shown (2 of 2)]
[im 1/17]
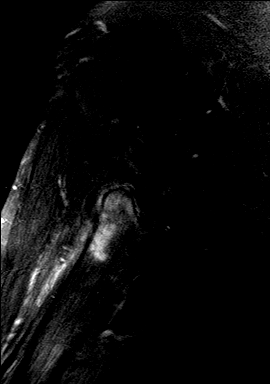
[im 6/17]
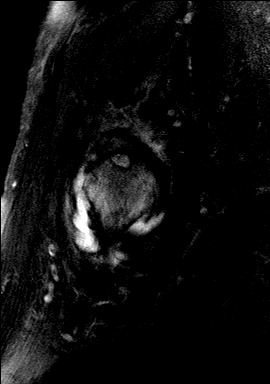
[im 11/17]
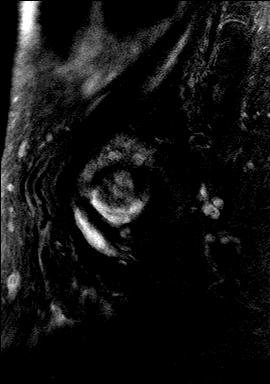
[im 17/17]
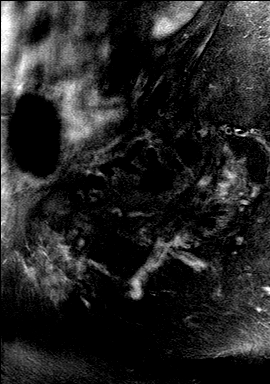

[Series 9: T1 fat-sat post-contrast · coronal · 4.0mm · 1.04mm/px · 6 of 24 slices shown (1 of 2)]
[im 1/24]
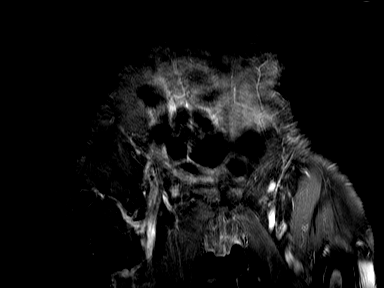
[im 5/24]
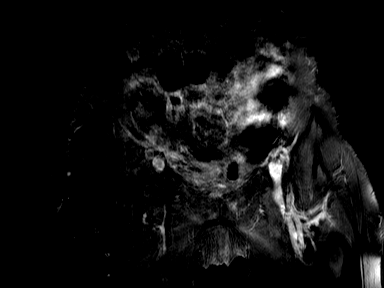
[im 10/24]
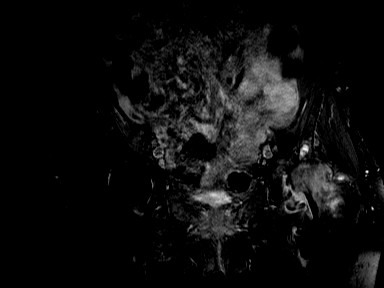
[im 14/24]
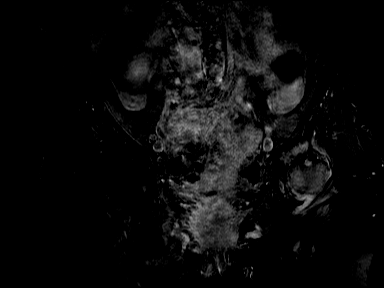
[im 19/24]
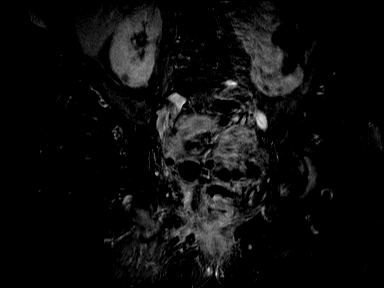
[im 24/24]
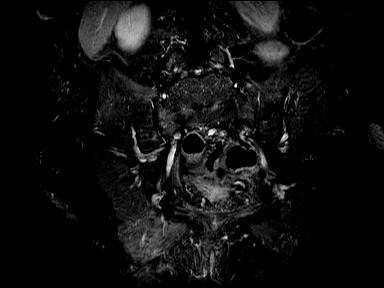

[Series 10: T1 fat-sat post-contrast · axial · 4.0mm · 0.99mm/px · 1 of 23 slices shown (2 of 2)]
[im 1/23]
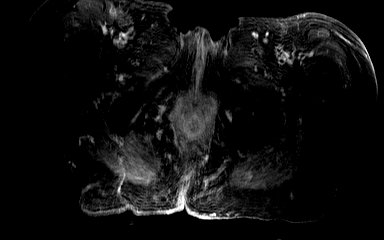

[31 of 48 positions shown; findings below may reference images not displayed]

EXAM

MRI left hip without and with contrast

INDICATION

septic left hip joint noted on recent MRI
CHRONIC WORSENING LEFT HIP PAIN, PAINFUL TO LAY ON BACK, WALK OR SIT.  MUST USE WALKER NOW.  PT ON
CHEMO FOR COLON CA.  15 ML GADAVIST RG

FINDINGS

Comparison MRI of the pelvis is dated 03/20/2021.

Coronal T1 and stir, axial T1 and fat suppressed T2, sagittal fat suppressed T2 and coronal and
axial postcontrast fat suppressed T1 weighted images of the left hip and pelvis were obtained.

There is avascular necrosis the superior portion of the left femoral head with the geographic
pattern of bone necrosis measuring 2.8 x 1.4 by 1.9 cm in diameter. There is marrow edema throughout
the entire left femoral head the majority of the femoral neck, extending into the upper portion of
the intertrochanteric region. This enhances uniformly with contrast.

There is a joint effusion with enhancement of the synovial margins. The joint effusion appears
slightly smaller than on the prior study.

Subchondral cysts and marrow edema are identified throughout the left acetabulum, also with
enhancement of these regions following contrast administration.

There is a linear low signal area within the subcapital portion left femoral neck which is
suspicious for nondisplaced fracture, possibly pathologic fracture.

Rectal perirectal tumor mass to the left of the midline appears unchanged from the recent
comparison study.

Bilateral sacral insufficiency fractures are again noted.

IMPRESSION

There is evidence of avascular necrosis of the left femoral head. There is marrow edema and
enhancement through the entire left femoral head, neck and upper intertrochanteric region with
abnormal enhancement following contrast. There also subchondral cystic changes, marrow edema and
enhancement of the left acetabulum. There is a joint effusion with enhancement of the synovial
margin of the left hip. The joint effusion is slightly smaller than on the prior study.

There is also a suspected nondisplaced pathologic fracture of the left femoral neck.

Findings remain suspicious for a septic joint with a osteomyelitis and pathologic fracture left
femoral neck. A less likely consideration would include avascular necrosis of a large portion left
femoral head and neck with pathologic fracture and secondary reactive synovitis. Progressive
degenerative changes with subchondral cyst formation, marrow edema and enhancement of left
acetabulum are also noted.

Rectal and perirectal mass on the left appears unchanged from the recent comparison study.

Tech Notes:

CHRONIC WORSENING LEFT HIP PAIN, PAINFUL TO LAY ON BACK, WALK OR SIT.  MUST USE WALKER NOW.  PT ON
CHEMO FOR COLON CA.  15 ML GADAVIST RG

## 2021-11-16 IMAGING — CR [ID]
1 series · 1 of 1 positions shown · non-contrast
Comparison: none

[x chest ap]
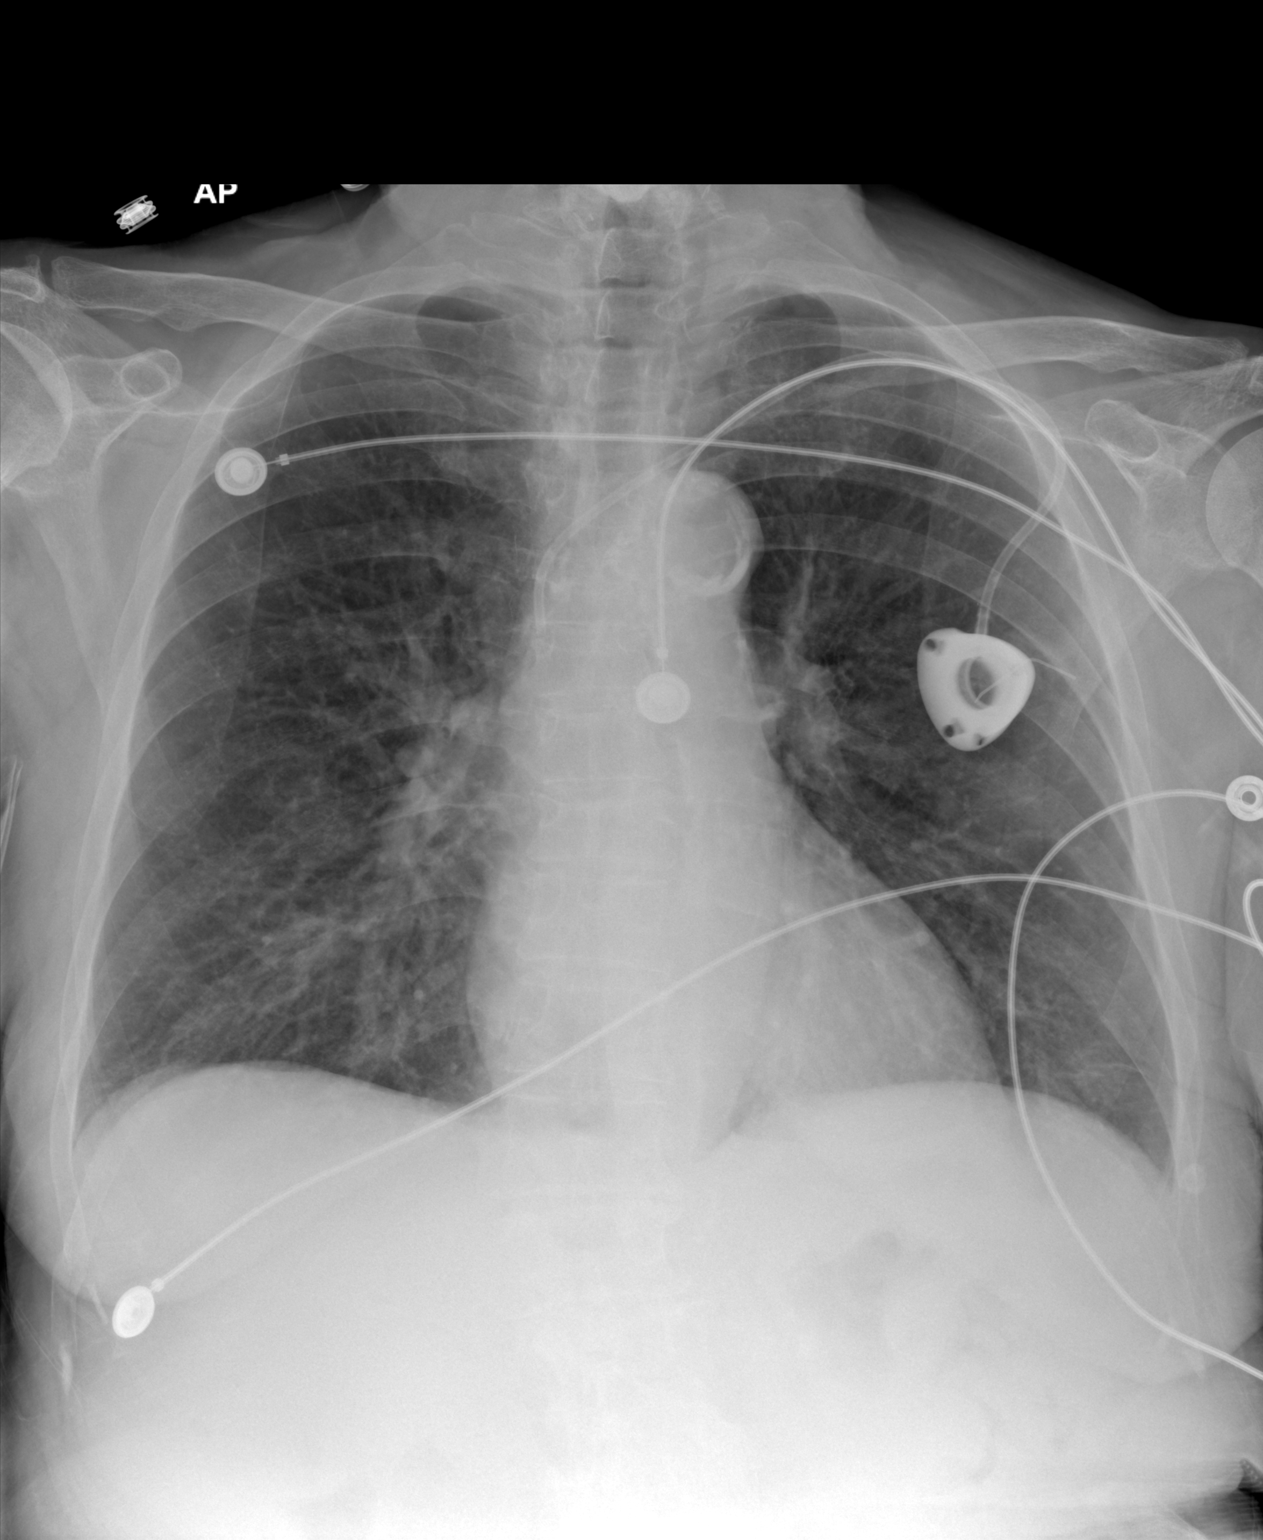

[1 of 1 positions shown; findings below may reference images not displayed]

EXAM

RADIOLOGICAL EXAMINATION, CHEST; SINGLE VIEW, FRONTAL CPT 66868

INDICATION

fever/cough
PT C/O WEAKNESS AND FEVER X1 DAY. HX OF COLORECTAL CANCER, COPD, HTN. PT HAS A PORT. AK/JT

TECHNIQUE

Single PA portable view of the chest was performed.

COMPARISONS

08/07/2021.

FINDINGS

Left-sided Port-A-Cath. The heart appears normal in size. Mild bilateral hilar prominence. Mild
peribronchial thickening. No gross pneumothorax. No dense consolidation. No acute fracture. Limited
evaluation of the thoracic spine.

IMPRESSION

1. Mild peribronchial thickening. No dense consolidation.

Tech Notes:

PT C/O WEAKNESS AND FEVER X1 DAY. HX OF COLORECTAL CANCER, COPD, HTN. PT HAS A PORT. AK/JT

## 2022-11-02 DEATH — deceased
